# Patient Record
Sex: Male | Born: 1935 | Race: White | Hispanic: No | Marital: Single | Smoking: Former smoker
Health system: Southern US, Community
[De-identification: ages and names within clinical notes are randomized; demographics above are authoritative.]

## PROBLEM LIST (undated history)

## (undated) DIAGNOSIS — I499 Cardiac arrhythmia, unspecified: Secondary | ICD-10-CM

## (undated) HISTORY — PX: PACEMAKER INSERTION: SHX728

## (undated) HISTORY — PX: CHOLECYSTECTOMY: SHX55

---

## 2019-10-09 DIAGNOSIS — I10 Essential (primary) hypertension: Secondary | ICD-10-CM

## 2019-10-09 DIAGNOSIS — R197 Diarrhea, unspecified: Secondary | ICD-10-CM

## 2019-10-09 DIAGNOSIS — R531 Weakness: Secondary | ICD-10-CM

## 2019-10-09 DIAGNOSIS — D649 Anemia, unspecified: Secondary | ICD-10-CM

## 2019-10-09 DIAGNOSIS — A419 Sepsis, unspecified organism: Secondary | ICD-10-CM

## 2019-10-09 DIAGNOSIS — N3001 Acute cystitis with hematuria: Secondary | ICD-10-CM

## 2019-10-09 DIAGNOSIS — I4891 Unspecified atrial fibrillation: Secondary | ICD-10-CM

## 2019-10-10 DIAGNOSIS — I10 Essential (primary) hypertension: Secondary | ICD-10-CM | POA: Diagnosis not present

## 2019-10-10 DIAGNOSIS — N3001 Acute cystitis with hematuria: Secondary | ICD-10-CM | POA: Diagnosis not present

## 2019-10-10 DIAGNOSIS — A419 Sepsis, unspecified organism: Secondary | ICD-10-CM | POA: Diagnosis not present

## 2019-10-10 DIAGNOSIS — I4891 Unspecified atrial fibrillation: Secondary | ICD-10-CM | POA: Diagnosis not present

## 2019-10-11 DIAGNOSIS — N3001 Acute cystitis with hematuria: Secondary | ICD-10-CM | POA: Diagnosis not present

## 2019-10-11 DIAGNOSIS — I4891 Unspecified atrial fibrillation: Secondary | ICD-10-CM | POA: Diagnosis not present

## 2019-10-11 DIAGNOSIS — I10 Essential (primary) hypertension: Secondary | ICD-10-CM | POA: Diagnosis not present

## 2019-10-11 DIAGNOSIS — A419 Sepsis, unspecified organism: Secondary | ICD-10-CM | POA: Diagnosis not present

## 2019-10-12 ENCOUNTER — Encounter (HOSPITAL_COMMUNITY): Payer: Self-pay | Admitting: Internal Medicine

## 2019-10-12 ENCOUNTER — Inpatient Hospital Stay (HOSPITAL_COMMUNITY)
Admission: AD | Admit: 2019-10-12 | Discharge: 2019-10-19 | DRG: 377 | Disposition: A | Discharge status: Skilled Nursing Facility | Payer: 59 | Source: Other Acute Inpatient Hospital | Attending: Internal Medicine | Admitting: Internal Medicine

## 2019-10-12 ENCOUNTER — Other Ambulatory Visit: Payer: Self-pay

## 2019-10-12 ENCOUNTER — Inpatient Hospital Stay (HOSPITAL_COMMUNITY): Payer: 59

## 2019-10-12 DIAGNOSIS — D62 Acute posthemorrhagic anemia: Secondary | ICD-10-CM | POA: Diagnosis present

## 2019-10-12 DIAGNOSIS — Z87891 Personal history of nicotine dependence: Secondary | ICD-10-CM

## 2019-10-12 DIAGNOSIS — Z20822 Contact with and (suspected) exposure to covid-19: Secondary | ICD-10-CM | POA: Diagnosis present

## 2019-10-12 DIAGNOSIS — A419 Sepsis, unspecified organism: Secondary | ICD-10-CM | POA: Diagnosis present

## 2019-10-12 DIAGNOSIS — R05 Cough: Secondary | ICD-10-CM

## 2019-10-12 DIAGNOSIS — K922 Gastrointestinal hemorrhage, unspecified: Secondary | ICD-10-CM | POA: Diagnosis not present

## 2019-10-12 DIAGNOSIS — I4891 Unspecified atrial fibrillation: Secondary | ICD-10-CM | POA: Diagnosis present

## 2019-10-12 DIAGNOSIS — Z833 Family history of diabetes mellitus: Secondary | ICD-10-CM

## 2019-10-12 DIAGNOSIS — R0602 Shortness of breath: Secondary | ICD-10-CM

## 2019-10-12 DIAGNOSIS — N390 Urinary tract infection, site not specified: Secondary | ICD-10-CM | POA: Diagnosis present

## 2019-10-12 DIAGNOSIS — K449 Diaphragmatic hernia without obstruction or gangrene: Secondary | ICD-10-CM | POA: Diagnosis present

## 2019-10-12 DIAGNOSIS — M109 Gout, unspecified: Secondary | ICD-10-CM | POA: Diagnosis present

## 2019-10-12 DIAGNOSIS — E785 Hyperlipidemia, unspecified: Secondary | ICD-10-CM | POA: Diagnosis present

## 2019-10-12 DIAGNOSIS — K5711 Diverticulosis of small intestine without perforation or abscess with bleeding: Secondary | ICD-10-CM | POA: Diagnosis present

## 2019-10-12 DIAGNOSIS — K264 Chronic or unspecified duodenal ulcer with hemorrhage: Principal | ICD-10-CM | POA: Diagnosis present

## 2019-10-12 DIAGNOSIS — J209 Acute bronchitis, unspecified: Secondary | ICD-10-CM | POA: Diagnosis present

## 2019-10-12 DIAGNOSIS — I495 Sick sinus syndrome: Secondary | ICD-10-CM | POA: Diagnosis present

## 2019-10-12 DIAGNOSIS — Z7901 Long term (current) use of anticoagulants: Secondary | ICD-10-CM | POA: Diagnosis not present

## 2019-10-12 DIAGNOSIS — I480 Paroxysmal atrial fibrillation: Secondary | ICD-10-CM | POA: Diagnosis present

## 2019-10-12 DIAGNOSIS — Z6820 Body mass index (BMI) 20.0-20.9, adult: Secondary | ICD-10-CM | POA: Diagnosis not present

## 2019-10-12 DIAGNOSIS — R059 Cough, unspecified: Secondary | ICD-10-CM

## 2019-10-12 DIAGNOSIS — A4150 Gram-negative sepsis, unspecified: Secondary | ICD-10-CM | POA: Diagnosis present

## 2019-10-12 DIAGNOSIS — R64 Cachexia: Secondary | ICD-10-CM | POA: Diagnosis present

## 2019-10-12 DIAGNOSIS — Z950 Presence of cardiac pacemaker: Secondary | ICD-10-CM

## 2019-10-12 HISTORY — DX: Cardiac arrhythmia, unspecified: I49.9

## 2019-10-12 LAB — CBC WITH DIFFERENTIAL/PLATELET
Abs Immature Granulocytes: 0.16 10*3/uL — ABNORMAL HIGH (ref 0.00–0.07)
Basophils Absolute: 0 10*3/uL (ref 0.0–0.1)
Basophils Relative: 0 %
Eosinophils Absolute: 0.2 10*3/uL (ref 0.0–0.5)
Eosinophils Relative: 1 %
HCT: 20.1 % — ABNORMAL LOW (ref 39.0–52.0)
Hemoglobin: 6.6 g/dL — CL (ref 13.0–17.0)
Immature Granulocytes: 1 %
Lymphocytes Relative: 13 %
Lymphs Abs: 1.9 10*3/uL (ref 0.7–4.0)
MCH: 30 pg (ref 26.0–34.0)
MCHC: 32.8 g/dL (ref 30.0–36.0)
MCV: 91.4 fL (ref 80.0–100.0)
Monocytes Absolute: 0.8 10*3/uL (ref 0.1–1.0)
Monocytes Relative: 6 %
Neutro Abs: 11.7 10*3/uL — ABNORMAL HIGH (ref 1.7–7.7)
Neutrophils Relative %: 79 %
Platelets: 152 10*3/uL (ref 150–400)
RBC: 2.2 MIL/uL — ABNORMAL LOW (ref 4.22–5.81)
RDW: 15.7 % — ABNORMAL HIGH (ref 11.5–15.5)
WBC: 14.8 10*3/uL — ABNORMAL HIGH (ref 4.0–10.5)
nRBC: 0 % (ref 0.0–0.2)

## 2019-10-12 LAB — BASIC METABOLIC PANEL
Anion gap: 11 (ref 5–15)
BUN: 41 mg/dL — ABNORMAL HIGH (ref 8–23)
CO2: 19 mmol/L — ABNORMAL LOW (ref 22–32)
Calcium: 7.6 mg/dL — ABNORMAL LOW (ref 8.9–10.3)
Chloride: 113 mmol/L — ABNORMAL HIGH (ref 98–111)
Creatinine, Ser: 0.94 mg/dL (ref 0.61–1.24)
GFR calc Af Amer: >60 mL/min (ref >60)
GFR calc non Af Amer: >60 mL/min (ref >60)
Glucose, Bld: 115 mg/dL — ABNORMAL HIGH (ref 70–99)
Potassium: 4.2 mmol/L (ref 3.5–5.1)
Sodium: 143 mmol/L (ref 135–145)

## 2019-10-12 LAB — LACTIC ACID, PLASMA
Lactic Acid, Venous: 1.9 mmol/L (ref 0.5–1.9)
Lactic Acid, Venous: 1.9 mmol/L (ref 0.5–1.9)

## 2019-10-12 LAB — TROPONIN I (HIGH SENSITIVITY)
Troponin I (High Sensitivity): 28 ng/L — ABNORMAL HIGH (ref <18)
Troponin I (High Sensitivity): 29 ng/L — ABNORMAL HIGH (ref <18)

## 2019-10-12 LAB — HEPATIC FUNCTION PANEL
ALT: 26 U/L (ref 0–44)
AST: 38 U/L (ref 15–41)
Albumin: 2 g/dL — ABNORMAL LOW (ref 3.5–5.0)
Alkaline Phosphatase: 46 U/L (ref 38–126)
Bilirubin, Direct: 0.1 mg/dL (ref 0.0–0.2)
Indirect Bilirubin: 0.1 mg/dL — ABNORMAL LOW (ref 0.3–0.9)
Total Bilirubin: 0.2 mg/dL — ABNORMAL LOW (ref 0.3–1.2)
Total Protein: 4.7 g/dL — ABNORMAL LOW (ref 6.5–8.1)

## 2019-10-12 LAB — HEMOGLOBIN AND HEMATOCRIT, BLOOD
HCT: 25.5 % — ABNORMAL LOW (ref 39.0–52.0)
Hemoglobin: 8.6 g/dL — ABNORMAL LOW (ref 13.0–17.0)

## 2019-10-12 LAB — PROTIME-INR
INR: 1.4 — ABNORMAL HIGH (ref 0.8–1.2)
Prothrombin Time: 17.5 seconds — ABNORMAL HIGH (ref 11.4–15.2)

## 2019-10-12 LAB — TSH: TSH: 2.992 u[IU]/mL (ref 0.350–4.500)

## 2019-10-12 LAB — ABO/RH: ABO/RH(D): O NEG

## 2019-10-12 LAB — SARS CORONAVIRUS 2 (TAT 6-24 HRS): SARS Coronavirus 2: NEGATIVE

## 2019-10-12 LAB — PREPARE RBC (CROSSMATCH)

## 2019-10-12 MED ORDER — SODIUM CHLORIDE 0.9% IV SOLUTION: Freq: Once | INTRAVENOUS | Status: DC

## 2019-10-12 MED ORDER — SODIUM CHLORIDE 0.9 % IV SOLN: INTRAVENOUS | Status: DC

## 2019-10-12 MED ORDER — ONDANSETRON HCL 4 MG/2ML IJ SOLN: 4.0000 mg | Freq: Four times a day (QID) | INTRAMUSCULAR | Status: DC | PRN

## 2019-10-12 MED ORDER — SODIUM CHLORIDE 0.9% FLUSH
10.0000 mL | Freq: Two times a day (BID) | INTRAVENOUS | Status: DC
  Administered 2019-10-13 – 2019-10-18 (×7): 10 mL

## 2019-10-12 MED ORDER — METOPROLOL TARTRATE 5 MG/5ML IV SOLN
5.0000 mg | Freq: Four times a day (QID) | INTRAVENOUS | Status: DC
  Administered 2019-10-12 – 2019-10-13 (×6): 5 mg via INTRAVENOUS
  Filled 2019-10-12 (×6): qty 5

## 2019-10-12 MED ORDER — SODIUM CHLORIDE 0.9 % IV SOLN
80.0000 mg | Freq: Once | INTRAVENOUS | Status: AC
  Administered 2019-10-12: 05:00:00 80 mg via INTRAVENOUS
  Filled 2019-10-12: qty 80

## 2019-10-12 MED ORDER — SODIUM CHLORIDE 0.9 % IV SOLN
2.0000 g | INTRAVENOUS | Status: AC
  Administered 2019-10-12 – 2019-10-16 (×5): 2 g via INTRAVENOUS
  Filled 2019-10-12 (×4): qty 2
  Filled 2019-10-12: qty 20

## 2019-10-12 MED ORDER — PANTOPRAZOLE SODIUM 40 MG IV SOLR: 40.0000 mg | Freq: Two times a day (BID) | INTRAVENOUS | Status: DC

## 2019-10-12 MED ORDER — ACETAMINOPHEN 650 MG RE SUPP: 650.0000 mg | Freq: Four times a day (QID) | RECTAL | Status: DC | PRN

## 2019-10-12 MED ORDER — SODIUM CHLORIDE 0.9% FLUSH: 10.0000 mL | INTRAVENOUS | Status: DC | PRN

## 2019-10-12 MED ORDER — ACETAMINOPHEN 325 MG PO TABS: 650.0000 mg | ORAL_TABLET | Freq: Four times a day (QID) | ORAL | Status: DC | PRN

## 2019-10-12 MED ORDER — SODIUM CHLORIDE 0.9 % IV SOLN
8.0000 mg/h | INTRAVENOUS | Status: DC
  Administered 2019-10-12 – 2019-10-13 (×3): 8 mg/h via INTRAVENOUS
  Filled 2019-10-12 (×4): qty 80

## 2019-10-12 MED ORDER — ONDANSETRON HCL 4 MG PO TABS: 4.0000 mg | ORAL_TABLET | Freq: Four times a day (QID) | ORAL | Status: DC | PRN

## 2019-10-12 NOTE — H&P (Addendum)
History and Physical    Kenneth Howe:[id number] DOB: 1928/07/30 DOA: 10/12/2019  PCP: Patient, No Pcp Per  Patient coming from: Patient was transferred from [facility].  Chief Complaint: Rectal bleeding.  HPI: Kenneth Howe is a 85 y.o. male with history of A. fib gout and pacemaker placement was admitted to [facility] 3 days ago after being found to be weak and had some diarrhea.  Patient was diagnosed with UTI with sepsis and was placed on empiric antibiotics.  Per records urine culture was showing more than 100,000 gram-negative rods blood cultures had not any growth so far.  CT head and chest x-ray were unremarkable over there and Covid test was negative but while in the hospital patient started having dark stools and patient was on Eliquis which was discontinued and stool for occult blood was positive and hemoglobin gradually dropped from admission level of 12 6.  Since there was no GI coverage available patient was transferred to [facility] for further work-up including possible GI intervention.  On my exam patient is not in distress but is tachycardic and monitor shows A. fib with RVR.  Blood pressure is more than 120 systolic.  Patient is nonhypoxic and abdomen appears benign.  Patient's lab work show hemoglobin of 6.6 and WBC of 14.  Creatinine is normal.  I have ordered 2 units of PRBC since patient was found to have moderate amount of dark tarry stools after reaching from [place].  Protonix infusion has been started empiric antibiotics and admitted for further management.  Prior to admitting to [facility] patient was at [facility] for dehydration and renal failure.  Was admitted and received fluids and discharged home but had to come to [place] after patient became more weak.  ED Course: Patient is a direct admit.  Review of Systems: As per HPI, rest all negative.   Past Medical History:  Diagnosis Date  . Dysrhythmia     Past Surgical History:   Procedure Laterality Date  . CHOLECYSTECTOMY    . PACEMAKER INSERTION       reports that he has quit smoking. He has never used smokeless tobacco. He reports previous alcohol use. No history on file for drug.  Not on File  Family History  Problem Relation Age of Onset  . Diabetes Mellitus II Mother     Prior to Admission medications   Not on File    Physical Exam: Constitutional: Moderately built and nourished. Vitals:   10/12/19 0336 10/12/19 0400 10/12/19 0500 10/12/19 0608  BP: 140/88   117/72  Pulse: 74   (!) 28  Resp: 20 18 20 20   Temp: 98.4 F (36.9 C)     TempSrc: Oral     SpO2: 100%   97%  Weight: 73.6 kg      Eyes: Anicteric no pallor. ENMT: No discharge from the ears eyes nose or mouth. Neck: No mass felt.  No neck rigidity. Respiratory: No rhonchi or crepitations. Cardiovascular: S1-S2 heard tachycardic. Abdomen: Soft nontender bowel sounds present. Musculoskeletal: No edema. Skin: No rash. Neurologic: Alert awake oriented to time place and person.  Moves all extremities. Psychiatric: Appears normal.   Labs on Admission: I have personally reviewed following labs and imaging studies  CBC: Recent Labs  Lab 10/12/19 0443  WBC 14.8*  NEUTROABS 11.7*  HGB 6.6*  HCT 20.1*  MCV 91.4  PLT 152   Basic Metabolic Panel: Recent Labs  Lab 10/12/19 0443  NA 143  K 4.2  CL 113*  CO2 19*  GLUCOSE 115*  BUN 41*  CREATININE 0.94  CALCIUM 7.6*   GFR: CrCl cannot be calculated (Unknown ideal weight.). Liver Function Tests: Recent Labs  Lab 10/12/19 0443  AST 38  ALT 26  ALKPHOS 46  BILITOT 0.2*  PROT 4.7*  ALBUMIN 2.0*   No results for input(s): LIPASE, AMYLASE in the last 168 hours. No results for input(s): AMMONIA in the last 168 hours. Coagulation Profile: Recent Labs  Lab 10/12/19 0443  INR 1.4*   Cardiac Enzymes: No results for input(s): CKTOTAL, CKMB, CKMBINDEX, TROPONINI in the last 168 hours. BNP (last 3 results) No results  for input(s): PROBNP in the last 8760 hours. HbA1C: No results for input(s): HGBA1C in the last 72 hours. CBG: No results for input(s): GLUCAP in the last 168 hours. Lipid Profile: No results for input(s): CHOL, HDL, LDLCALC, TRIG, CHOLHDL, LDLDIRECT in the last 72 hours. Thyroid Function Tests: No results for input(s): TSH, T4TOTAL, FREET4, T3FREE, THYROIDAB in the last 72 hours. Anemia Panel: No results for input(s): VITAMINB12, FOLATE, FERRITIN, TIBC, IRON, RETICCTPCT in the last 72 hours. Urine analysis: No results found for: COLORURINE, APPEARANCEUR, LABSPEC, PHURINE, GLUCOSEU, HGBUR, BILIRUBINUR, KETONESUR, PROTEINUR, UROBILINOGEN, NITRITE, LEUKOCYTESUR Sepsis Labs: @LABRCNTIP (procalcitonin:4,lacticidven:4) )No results found for this or any previous visit (from the past 240 hour(s)).   Radiological Exams on Admission: No results found.  EKG: Independently reviewed.  A. fib with RVR.  Assessment/Plan Principal Problem:   Acute GI bleeding Active Problems:   Atrial fibrillation with RVR (HCC)   Acute blood loss anemia   Sepsis (HCC)    1. Acute GI bleeding in the setting of patient taking Eliquis -Eliquis has been held.  Patient has only received 1 unit of PRBC and since patient hemoglobin is still low below 7 I have ordered 2 more units of PRBC after consult.  We will keep patient n.p.o. continue Protonix infusion consult GI. 2. Acute blood loss anemia follow CBC after transfusion.  Eliquis on hold. 3. A. fib with RVR -I have placed patient on scheduled dose of IV metoprolol.  Eliquis on hold due to acute GI bleed patient agreeable. 4. Sepsis from UTI and blood cultures so far at [facility] was negative for any growth urine showed more than 100,000 gram-negative rods.  On ceftriaxone. 5. History of gout. 6. History of pacemaker placement.  COVID-19 test and chest x-ray are pending along with troponins.  Addendum -discussed with Dr. Watt Climes gastroenterologist about  patient admission and will be seeing patient in consult at this time requested clear liquid diet.  Planning EGD tomorrow but earlier if patient decompensates.   DVT prophylaxis: SCDs due to acute GI bleed. Code Status: Full code. Family Communication: I tried to reach patient's daughter but was unable to. Disposition Plan: Back to home when stable. Consults called: Dr. Watt Climes gastroenterologist. Admission status: Inpatient.   Rise Patience MD [facility] Pager [phone].  If 7PM-7AM, please contact night-coverage [email or web address] Password [email or web address]  10/12/2019, 6:08 AM

## 2019-10-12 NOTE — Progress Notes (Signed)
PROGRESS NOTE                                                                                                                                                                                                             Patient Demographics:    Kenneth Howe, is a 85 y.o. male, DOB - [birth year], UJW:[id number]  Admit date - 10/12/2019   Admitting Physician Eduard Clos, MD  Outpatient Primary MD for the patient is Patient, No Pcp Per  LOS - 0   No chief complaint on file.      Brief Narrative    This is a no charge note this patient seen and admitted earlier today by Dr. Toniann Fail.  HPI: Kenneth Howe is a 85 y.o. male with history of A. fib gout and pacemaker placement was admitted to [facility] 3 days ago after being found to be weak and had some diarrhea.  Patient was diagnosed with UTI with sepsis and was placed on empiric antibiotics.  Per records urine culture was showing more than 100,000 gram-negative rods blood cultures had not any growth so far.  CT head and chest x-ray were unremarkable over there and Covid test was negative but while in the hospital patient started having dark stools and patient was on Eliquis which was discontinued and stool for occult blood was positive and hemoglobin gradually dropped from admission level of 12 6.  Since there was no GI coverage available patient was transferred to [facility] for further work-up including possible GI intervention.  On my exam patient is not in distress but is tachycardic and monitor shows A. fib with RVR.  Blood pressure is more than 120 systolic.  Patient is nonhypoxic and abdomen appears benign.  Patient's lab work show hemoglobin of 6.6 and WBC of 14.  Creatinine is normal.  I have ordered 2 units of PRBC since patient was found to have moderate amount of dark tarry stools after reaching from [place].  Protonix infusion has been started empiric antibiotics and admitted for further  management.  Prior to admitting to [facility] patient was at [facility] for dehydration and renal failure.  Was admitted and received fluids and discharged home but had to come to [place] after patient became more weak   Subjective:    Kenneth Howe today denies any chest pain,  shortness of breath, no fever or chills .    Assessment  & Plan :    Principal Problem:   Acute GI bleeding Active Problems:   Atrial fibrillation with RVR (HCC)   Acute blood loss anemia   Sepsis (HCC)  Acute GI bleeding in the setting of patient taking Eliquis - Continue to hold Eliquis, patient received 1 unit [facility], had another 2 units ordered as well, will be total of 3 units today, will check CBC in the afternoon, and transfuse to keep hemoglobin less than 7 . -GI input greatly appreciated, plan for endoscopy tomorrow . -Continue with Protonix drip   Acute blood loss anemia follow CBC after transfusion.  Eliquis on hold.  A. fib with RVR -I have placed patient on scheduled dose of IV metoprolol.  Eliquis on hold due to acute GI bleed patient agreeable.  Sepsis from UTI and blood cultures so far at [facility] was negative for any growth urine showed more than 100,000 gram-negative rods.  On ceftriaxone.  History of gout.  History of pacemaker placement.   COVID-19 Labs  No results for input(s): DDIMER, FERRITIN, LDH, CRP in the last 72 hours.  Lab Results  Component Value Date   SARSCOV2NAA NEGATIVE 10/12/2019     Code Status : Full  Family Communication  : None at bedside  Disposition Plan  : Home  Barriers For Discharge : With active GI bleed requiring frequent H&H monitoring, transfusion and will require endoscopy  Consults  :  GI  Procedures  : None  DVT Prophylaxis  :  Home Eliquis on Hold  Lab Results  Component Value Date   PLT 152 10/12/2019    Antibiotics  :    Anti-infectives (From admission, onward)   Start     Dose/Rate Route  Frequency Ordered Stop   10/12/19 0615  cefTRIAXone (ROCEPHIN) 2 g in sodium chloride 0.9 % 100 mL IVPB     2 g 200 mL/hr over 30 Minutes Intravenous Every 24 hours 10/12/19 0603          Objective:   Vitals:   10/12/19 0718 10/12/19 1018 10/12/19 1036 10/12/19 1235  BP: 91/60 107/70 103/80 112/70  Pulse: 91 99 91 96  Resp: 18 18 20  (!) 23  Temp: 97.8 F (36.6 C) 97.8 F (36.6 C) 97.6 F (36.4 C) 98.6 F (37 C)  TempSrc: Oral Oral Oral Oral  SpO2: 98% 100% 100% 100%  Weight:        Wt Readings from Last 3 Encounters:  10/12/19 73.6 kg     Intake/Output Summary (Last 24 hours) at 10/12/2019 1347 Last data filed at 10/12/2019 0718 Gross per 24 hour  Intake 315 ml  Output --  Net 315 ml     Physical Exam  Awake Alert, Oriented X 3, No new F.N deficits, Normal affect Symmetrical Chest wall movement, Good air movement bilaterally, CTAB RRR,No Gallops,Rubs or new Murmurs, No Parasternal Heave +ve B.Sounds, Abd Soft,No rebound - guarding or rigidity. No Cyanosis, Clubbing or edema, No new Rash or bruise      Data Review:    CBC Recent Labs  Lab 10/12/19 0443  WBC 14.8*  HGB 6.6*  HCT 20.1*  PLT 152  MCV 91.4  MCH 30.0  MCHC 32.8  RDW 15.7*  LYMPHSABS 1.9  MONOABS 0.8  EOSABS 0.2  BASOSABS 0.0    Chemistries  Recent Labs  Lab 10/12/19 0443  NA 143  K 4.2  CL 113*  CO2 19*  GLUCOSE 115*  BUN 41*  CREATININE 0.94  CALCIUM 7.6*  AST 38  ALT 26  ALKPHOS 46  BILITOT 0.2*   ------------------------------------------------------------------------------------------------------------------ No results for input(s): CHOL, HDL, LDLCALC, TRIG, CHOLHDL, LDLDIRECT in the last 72 hours.  No results found for: HGBA1C ------------------------------------------------------------------------------------------------------------------ Recent Labs    10/12/19 0443  TSH 2.992    ------------------------------------------------------------------------------------------------------------------ No results for input(s): VITAMINB12, FOLATE, FERRITIN, TIBC, IRON, RETICCTPCT in the last 72 hours.  Coagulation profile Recent Labs  Lab 10/12/19 0443  INR 1.4*    No results for input(s): DDIMER in the last 72 hours.  Cardiac Enzymes No results for input(s): CKMB, TROPONINI, MYOGLOBIN in the last 168 hours.  Invalid input(s): CK ------------------------------------------------------------------------------------------------------------------ No results found for: BNP  Inpatient Medications  Scheduled Meds: . sodium chloride   Intravenous Once  . metoprolol tartrate  5 mg Intravenous Q6H  . [START ON 10/15/2019] pantoprazole  40 mg Intravenous Q12H   Continuous Infusions: . cefTRIAXone (ROCEPHIN)  IV 2 g (10/12/19 0656)  . pantoprozole (PROTONIX) infusion 8 mg/hr (10/12/19 0457)   PRN Meds:.acetaminophen **OR** acetaminophen, ondansetron **OR** ondansetron (ZOFRAN) IV  Micro Results Recent Results (from the past 240 hour(s))  SARS CORONAVIRUS 2 (TAT 6-24 HRS) Nasopharyngeal Nasopharyngeal Swab     Status: None   Collection Time: 10/12/19  5:09 AM   Specimen: Nasopharyngeal Swab  Result Value Ref Range Status   SARS Coronavirus 2 NEGATIVE NEGATIVE Final    Comment: (NOTE) SARS-CoV-2 target nucleic acids are NOT DETECTED. The SARS-CoV-2 RNA is generally detectable in upper and lower respiratory specimens during the acute phase of infection. Negative results do not preclude SARS-CoV-2 infection, do not rule out co-infections with other pathogens, and should not be used as the sole basis for treatment or other patient management decisions. Negative results must be combined with clinical observations, patient history, and epidemiological information. The expected result is Negative. Fact Sheet for Patients: SugarRoll.be Fact  Sheet for Healthcare Providers: https://www.woods-mathews.com/ This test is not yet approved or cleared by the [place] FDA and  has been authorized for detection and/or diagnosis of SARS-CoV-2 by FDA under an Emergency Use Authorization (EUA). This EUA will remain  in effect (meaning this test can be used) for the duration of the COVID-19 declaration under Section 56 4(b)(1) of the Act, 21 U.S.C. section 360bbb-3(b)(1), unless the authorization is terminated or revoked sooner. Performed at [facility], [place] [address] [place], [place] [address]     Radiology Reports DG Chest [place] 1 View  Result Date: 10/12/2019 CLINICAL DATA:  Shortness of breath.  Pending COVID 19 test results EXAM: PORTABLE CHEST 1 VIEW COMPARISON:  October 09, 2019 FINDINGS: The heart size and mediastinal contours are stable. Cardiac pacemaker is unchanged. Both lungs are clear. The visualized skeletal structures are stable. IMPRESSION: No active disease. Electronically Signed   By: Abelardo Diesel M.D.   On: 10/12/2019 08:31     Phillips Climes M.D on 10/12/2019 at 1:47 PM  Between 7am to 7pm - Pager - [phone]  After 7pm go to [email or web address] - password [facility]  [facility] -  Office  [phone]

## 2019-10-12 NOTE — Progress Notes (Signed)
Admitted from [facility] to [place] and oriented.Attached to cardiac monitoring,CCMD notified,V/S checked,Covid test done.Will continue to monitor.

## 2019-10-12 NOTE — Consult Note (Signed)
Reason for Consult: GI bleed Referring Physician: Hospital team  Kenneth Howe is an 85 y.o. male.  HPI: Patient seen and examined and discussed with his referring hospital as well as the hospital team here and he has been on some blood thinners and does use some Aleve for headaches at home and does have a history of ulcers and he did have a colonoscopy years ago but right now he has no GI complaints and has not had a bowel movement since he has been here and has no other complaints  Past Medical History:  Diagnosis Date  . Dysrhythmia     Past Surgical History:  Procedure Laterality Date  . CHOLECYSTECTOMY    . PACEMAKER INSERTION      Family History  Problem Relation Age of Onset  . Diabetes Mellitus II Mother     Social History:  reports that he has quit smoking. He has never used smokeless tobacco. He reports previous alcohol use. No history on file for drug.  Allergies: Not on File  Medications: I have reviewed the patient's current medications.  Results for orders placed or performed during the hospital encounter of 10/12/19 (from the past 48 hour(s))  Type and screen [facility]     Status: None (Preliminary result)   Collection Time: 10/12/19  4:40 AM  Result Value Ref Range   ABO/RH(D) O NEG    Antibody Screen NEG    Sample Expiration 10/15/2019,2359    Unit Number [id number]    Blood Component Type RED CELLS,LR    Unit division 00    Status of Unit ISSUED    Transfusion Status OK TO TRANSFUSE    Crossmatch Result      Compatible Performed at [facility], [address] [place], [place] [address]    Unit Number [id number]    Blood Component Type RED CELLS,LR    Unit division 00    Status of Unit ALLOCATED    Transfusion Status OK TO TRANSFUSE    Crossmatch Result Compatible   ABO/Rh     Status: None (Preliminary result)   Collection Time: 10/12/19  4:40 AM  Result Value Ref Range   ABO/RH(D)      O NEG Performed at [facility], [address] [place], [place] [address]   CBC with Differential/Platelet     Status: Abnormal   Collection Time: 10/12/19  4:43 AM  Result Value Ref Range   WBC 14.8 (H) 4.0 - 10.5 K/uL   RBC 2.20 (L) 4.22 - 5.81 MIL/uL   Hemoglobin 6.6 (LL) 13.0 - 17.0 g/dL    Comment: REPEATED TO VERIFY THIS CRITICAL RESULT HAS VERIFIED AND BEEN CALLED TO RN REBIN A. BY MESSAN HOUEGNIFIO ON 12 25 2020 AT 0546, AND HAS BEEN READ BACK.     HCT 20.1 (L) 39.0 - 52.0 %   MCV 91.4 80.0 - 100.0 fL   MCH 30.0 26.0 - 34.0 pg   MCHC 32.8 30.0 - 36.0 g/dL   RDW 62.915.7 (H) 52.811.5 - 41.315.5 %   Platelets 152 150 - 400 K/uL   nRBC 0.0 0.0 - 0.2 %   Neutrophils Relative % 79 %   Neutro Abs 11.7 (H) 1.7 - 7.7 K/uL   Lymphocytes Relative 13 %   Lymphs Abs 1.9 0.7 - 4.0 K/uL   Monocytes Relative 6 %   Monocytes Absolute 0.8 0.1 - 1.0 K/uL   Eosinophils Relative 1 %   Eosinophils Absolute  0.2 0.0 - 0.5 K/uL   Basophils Relative 0 %   Basophils Absolute 0.0 0.0 - 0.1 K/uL   Immature Granulocytes 1 %   Abs Immature Granulocytes 0.16 (H) 0.00 - 0.07 K/uL    Comment: Performed at [facility] [address], [place], [place] [address]  Basic metabolic panel     Status: Abnormal   Collection Time: 10/12/19  4:43 AM  Result Value Ref Range   Sodium 143 135 - 145 mmol/L   Potassium 4.2 3.5 - 5.1 mmol/L   Chloride 113 (H) 98 - 111 mmol/L   CO2 19 (L) 22 - 32 mmol/L   Glucose, Bld 115 (H) 70 - 99 mg/dL   BUN 41 (H) 8 - 23 mg/dL   Creatinine, Ser 0.94 0.61 - 1.24 mg/dL   Calcium 7.6 (L) 8.9 - 10.3 mg/dL   GFR calc non Af Amer >60 >60 mL/min   GFR calc Af Amer >60 >60 mL/min   Anion gap 11 5 - 15    Comment: Performed at [facility], [place] [address], [place], [place] [address]  Hepatic function panel     Status: Abnormal   Collection Time: 10/12/19  4:43 AM  Result Value Ref Range   Total Protein 4.7 (L) 6.5 - 8.1 g/dL   Albumin 2.0 (L) 3.5 - 5.0 g/dL   AST 38 15 - 41 U/L   ALT 26 0 - 44 U/L    Alkaline Phosphatase 46 38 - 126 U/L   Total Bilirubin 0.2 (L) 0.3 - 1.2 mg/dL   Bilirubin, Direct 0.1 0.0 - 0.2 mg/dL   Indirect Bilirubin 0.1 (L) 0.3 - 0.9 mg/dL    Comment: Performed at [facility] [address] [place], [place] [address]  Protime-INR     Status: Abnormal   Collection Time: 10/12/19  4:43 AM  Result Value Ref Range   Prothrombin Time 17.5 (H) 11.4 - 15.2 seconds   INR 1.4 (H) 0.8 - 1.2    Comment: (NOTE) INR goal varies based on device and disease states. Performed at [facility], [place] [address], [place], [place] [address]   Lactic acid, plasma     Status: None   Collection Time: 10/12/19  4:43 AM  Result Value Ref Range   Lactic Acid, Venous 1.9 0.5 - 1.9 mmol/L    Comment: Performed at [facility] [address] [place], [place] [address]  TSH     Status: None   Collection Time: 10/12/19  4:43 AM  Result Value Ref Range   TSH 2.992 0.350 - 4.500 uIU/mL    Comment: Performed by a 3rd Generation assay with a functional sensitivity of <=0.01 uIU/mL. Performed at [facility], [place] [address] [place], [place] [address]   Troponin I (High Sensitivity)     Status: Abnormal   Collection Time: 10/12/19  4:43 AM  Result Value Ref Range   Troponin I (High Sensitivity) 28 (H) <18 ng/L    Comment: (NOTE) Elevated high sensitivity troponin I (hsTnI) values and significant  changes across serial measurements may suggest ACS but many other  chronic and acute conditions are known to elevate hsTnI results.  Refer to the "Links" section for chest pain algorithms and additional  guidance. Performed at [facility], [place] [address], [place], [place] [address]   Lactic acid, plasma     Status: None   Collection Time: 10/12/19  6:04 AM  Result Value Ref Range   Lactic Acid, Venous 1.9 0.5 - 1.9 mmol/L    Comment: Performed at [facility], [address], [place], [place] [address]  Troponin I (High Sensitivity)     Status:  Abnormal   Collection Time: 10/12/19  6:04 AM  Result Value Ref Range   Troponin I (High Sensitivity) 29 (H) <18 ng/L    Comment: (NOTE) Elevated high sensitivity troponin I (hsTnI) values and significant  changes across serial measurements may suggest ACS but many other  chronic and acute conditions are known to elevate hsTnI results.  Refer to the "Links" section for chest pain algorithms and additional  guidance. Performed at [facility], [address], [place], [place] [address]   Prepare RBC     Status: None   Collection Time: 10/12/19  6:30 AM  Result Value Ref Range   Order Confirmation      ORDER PROCESSED BY BLOOD BANK Performed at [facility], [address] [place], [place] [address]     DG Chest Port 1 View  Result Date: 10/12/2019 CLINICAL DATA:  Shortness of breath.  Pending COVID 19 test results EXAM: PORTABLE CHEST 1 VIEW COMPARISON:  October 09, 2019 FINDINGS: The heart size and mediastinal contours are stable. Cardiac pacemaker is unchanged. Both lungs are clear. The visualized skeletal structures are stable. IMPRESSION: No active disease. Electronically Signed   By: Sherian Rein M.D.   On: 10/12/2019 08:31    Review of Systems negative except above Blood pressure 91/60, pulse 91, temperature 97.8 F (36.6 C), temperature source Oral, resp. rate 18, weight 73.6 kg, SpO2 98 %. Physical Exam vital signs stable afebrile no acute distress lungs are clear decreased heart sounds abdomen is soft nontender BUN up over baseline decreased hemoglobin white count minimally elevated Assessment/Plan: Melena in a patient with increased BUN on blood thinners and possibly some nonsteroidals at home with history of ulcers Plan: We discussed endoscopy including the risks and he has had one before he says and will allow clear liquids today and proceed with endoscopy tomorrow and call me sooner if signs of bleeding or other GI question or problem  Zakariah Urwin  E 10/12/2019, 9:00 AM

## 2019-10-13 ENCOUNTER — Encounter (HOSPITAL_COMMUNITY): Payer: Self-pay | Admitting: Internal Medicine

## 2019-10-13 ENCOUNTER — Inpatient Hospital Stay (HOSPITAL_COMMUNITY): Payer: 59 | Admitting: Certified Registered"

## 2019-10-13 ENCOUNTER — Encounter (HOSPITAL_COMMUNITY)
Admission: AD | Disposition: A | Discharge status: Skilled Nursing Facility | Payer: Self-pay | Source: Other Acute Inpatient Hospital | Attending: Internal Medicine

## 2019-10-13 ENCOUNTER — Other Ambulatory Visit: Payer: Self-pay

## 2019-10-13 HISTORY — PX: ESOPHAGOGASTRODUODENOSCOPY (EGD) WITH PROPOFOL: SHX5813

## 2019-10-13 LAB — TYPE AND SCREEN
ABO/RH(D): O NEG
Antibody Screen: NEGATIVE
Unit division: 0
Unit division: 0

## 2019-10-13 LAB — GLUCOSE, CAPILLARY
Glucose-Capillary: 77 mg/dL (ref 70–99)
Glucose-Capillary: 78 mg/dL (ref 70–99)
Glucose-Capillary: 85 mg/dL (ref 70–99)
Glucose-Capillary: 88 mg/dL (ref 70–99)

## 2019-10-13 LAB — BASIC METABOLIC PANEL
Anion gap: 6 (ref 5–15)
BUN: 28 mg/dL — ABNORMAL HIGH (ref 8–23)
CO2: 21 mmol/L — ABNORMAL LOW (ref 22–32)
Calcium: 7.7 mg/dL — ABNORMAL LOW (ref 8.9–10.3)
Chloride: 113 mmol/L — ABNORMAL HIGH (ref 98–111)
Creatinine, Ser: 0.94 mg/dL (ref 0.61–1.24)
GFR calc Af Amer: >60 mL/min (ref >60)
GFR calc non Af Amer: >60 mL/min (ref >60)
Glucose, Bld: 97 mg/dL (ref 70–99)
Potassium: 3.8 mmol/L (ref 3.5–5.1)
Sodium: 140 mmol/L (ref 135–145)

## 2019-10-13 LAB — CBC
HCT: 27.3 % — ABNORMAL LOW (ref 39.0–52.0)
Hemoglobin: 9 g/dL — ABNORMAL LOW (ref 13.0–17.0)
MCH: 30 pg (ref 26.0–34.0)
MCHC: 33 g/dL (ref 30.0–36.0)
MCV: 91 fL (ref 80.0–100.0)
Platelets: 160 10*3/uL (ref 150–400)
RBC: 3 MIL/uL — ABNORMAL LOW (ref 4.22–5.81)
RDW: 15.6 % — ABNORMAL HIGH (ref 11.5–15.5)
WBC: 10.9 10*3/uL — ABNORMAL HIGH (ref 4.0–10.5)
nRBC: 0.6 % — ABNORMAL HIGH (ref 0.0–0.2)

## 2019-10-13 LAB — BPAM RBC
Blood Product Expiration Date: 202012282359
Blood Product Expiration Date: 202101042359
ISSUE DATE / TIME: 202012250644
ISSUE DATE / TIME: 202012251007
Unit Type and Rh: 9500
Unit Type and Rh: 9500

## 2019-10-13 SURGERY — ESOPHAGOGASTRODUODENOSCOPY (EGD) WITH PROPOFOL
Anesthesia: Monitor Anesthesia Care

## 2019-10-13 MED ORDER — PRAVASTATIN SODIUM 40 MG PO TABS
40.0000 mg | ORAL_TABLET | Freq: Every day | ORAL | Status: DC
  Administered 2019-10-13 – 2019-10-18 (×6): 40 mg via ORAL
  Filled 2019-10-13 (×6): qty 1

## 2019-10-13 MED ORDER — PANTOPRAZOLE SODIUM 40 MG PO TBEC
40.0000 mg | DELAYED_RELEASE_TABLET | Freq: Two times a day (BID) | ORAL | Status: DC
  Administered 2019-10-13 – 2019-10-19 (×12): 40 mg via ORAL
  Filled 2019-10-13 (×11): qty 1

## 2019-10-13 MED ORDER — LACTATED RINGERS IV SOLN: INTRAVENOUS | Status: DC

## 2019-10-13 MED ORDER — ALLOPURINOL 100 MG PO TABS
100.0000 mg | ORAL_TABLET | Freq: Every day | ORAL | Status: DC
  Administered 2019-10-13 – 2019-10-19 (×7): 100 mg via ORAL
  Filled 2019-10-13 (×7): qty 1

## 2019-10-13 MED ORDER — ENSURE ENLIVE PO LIQD
237.0000 mL | Freq: Two times a day (BID) | ORAL | Status: DC
  Administered 2019-10-14 – 2019-10-19 (×7): 237 mL via ORAL

## 2019-10-13 MED ORDER — METOPROLOL SUCCINATE ER 25 MG PO TB24
25.0000 mg | ORAL_TABLET | Freq: Every day | ORAL | Status: DC
  Administered 2019-10-14 – 2019-10-19 (×6): 25 mg via ORAL
  Filled 2019-10-13 (×6): qty 1

## 2019-10-13 MED ORDER — PROPOFOL 500 MG/50ML IV EMUL
INTRAVENOUS | Status: DC | PRN
  Administered 2019-10-13: 100 ug/kg/min via INTRAVENOUS

## 2019-10-13 SURGICAL SUPPLY — 14 items

## 2019-10-13 NOTE — Progress Notes (Addendum)
PROGRESS NOTE                                                                                                                                                                                                             Patient Demographics:    Kenneth Howe, is a 85 y.o. male, DOB - 1927/11/24, EXB:[id number]  Admit date - 10/12/2019   Admitting Physician Eduard Clos, MD  Outpatient Primary MD for the patient is Patient, No Pcp Per  LOS - 1   No chief complaint on file.      Brief Narrative    85 y.o. male with history of A. Fib, on Eliquis, gout gout and pacemaker placement was admitted to [facility] 3 days ago after being found to be weak and had some diarrhea.  Patient was diagnosed with UTI with sepsis and was placed on empiric antibiotics.  Per records urine culture was showing more than 100,000 gram-negative rods blood cultures had not any growth so far., while in the hospital patient started having dark stools and patient was on Eliquis, his hemoglobin dropped from 12.6 to 5.5, he was transferred to [facility] for further work-up.  He did require 2 units PRBC transfusion here(another 1 units was received at [facility]), patient went for endoscopy 12/26, significant for duodenal ulcer(with no stigmata of bleeding).   Subjective:    Kenneth Howe today denies any chest pain, shortness of breath, no fever or chills , he reports generalized weakness and fatigue.   Assessment  & Plan :    Principal Problem:   Acute GI bleeding Active Problems:   Atrial fibrillation with RVR (HCC)   Acute blood loss anemia   Sepsis (HCC)  Acute GI bleeding in the setting of patient taking Eliquis  -  patient received 1 unit at [facility] ED, had another 2 units received here as well . -Eliquis currently on hold . -On Protonix drip . -GI consult greatly appreciated, status post endoscopy 12/26 significant  for One non-bleeding cratered duodenal ulcer  with no stigmata of bleeding was found in the duodenal bulb, GI recommendation to continue PPI twice daily for 2 weeks, and to avoid NSAIDs, and to resume Eliquis in few days. -Currently on soft diet.  Acute blood loss anemia  -Secondary to upper GI bleed, monitor CBC closely and transfuse as  needed  Sepsis from UTI  - blood cultures so far at [facility] was negative for any growth urine showed more than 100,000 gram-negative rods.  On ceftriaxone.  A. fib with RVR -  On p.o. metoprolol at home for heart rate control, was resumed. -Request remains on hold, can be resumed in few days per GI recommendation.  History of gout. -Continue with allopurinol  Hyperlipidemia -Continue with pravastatin  History of pacemaker placement.   COVID-19 Labs  No results for input(s): DDIMER, FERRITIN, LDH, CRP in the last 72 hours.  Lab Results  Component Value Date   [place]ripp NEGATIVE 10/12/2019     Code Status : Full  Family Communication  : None at bedside  Disposition Plan  : Patient appears to be extremely frail, will consult PT is likely will need SNF  Consults  :  GI  Procedures  : EGD  DVT Prophylaxis  :  Home Eliquis on Hold  Lab Results  Component Value Date   PLT 160 10/13/2019    Antibiotics  :    Anti-infectives (From admission, onward)   Start     Dose/Rate Route Frequency Ordered Stop   10/12/19 0615  cefTRIAXone (ROCEPHIN) 2 g in sodium chloride 0.9 % 100 mL IVPB     2 g 200 mL/hr over 30 Minutes Intravenous Every 24 hours 10/12/19 0603          Objective:   Vitals:   10/13/19 0956 10/13/19 1006 10/13/19 1010 10/13/19 1245  BP: (!) 87/54 (!) 87/47 (!) 105/55 112/72  Pulse: 100 85 92 72  Resp: 16 16 (!) 21 16  Temp: 97.8 F (36.6 C)   98.4 F (36.9 C)  TempSrc: Axillary   Oral  SpO2: 96% 100% 94% 100%  Weight:      Height:        Wt Readings from Last 3 Encounters:  10/12/19 73.6 kg     Intake/Output Summary (Last 24 hours) at  10/13/2019 1448 Last data filed at 10/13/2019 1245 Gross per 24 hour  Intake 460 ml  Output 1400 ml  Net -940 ml     Physical Exam Awake Alert, Oriented X 3, No new F.N deficits, Normal affect, extremely frail, Symmetrical Chest wall movement, Good air movement bilaterally, CTAB RRR,No Gallops,Rubs or new Murmurs, No Parasternal Heave +ve B.Sounds, Abd Soft, No tenderness, No rebound - guarding or rigidity. No Cyanosis, Clubbing or edema, No new Rash or bruise       Data Review:    CBC Recent Labs  Lab 10/12/19 0443 10/12/19 1735 10/13/19 0427  WBC 14.8*  --  10.9*  HGB 6.6* 8.6* 9.0*  HCT 20.1* 25.5* 27.3*  PLT 152  --  160  MCV 91.4  --  91.0  MCH 30.0  --  30.0  MCHC 32.8  --  33.0  RDW 15.7*  --  15.6*  LYMPHSABS 1.9  --   --   MONOABS 0.8  --   --   EOSABS 0.2  --   --   BASOSABS 0.0  --   --     Chemistries  Recent Labs  Lab 10/12/19 0443 10/13/19 0427  NA 143 140  K 4.2 3.8  CL 113* 113*  CO2 19* 21*  GLUCOSE 115* 97  BUN 41* 28*  CREATININE 0.94 0.94  CALCIUM 7.6* 7.7*  AST 38  --   ALT 26  --   ALKPHOS 46  --   BILITOT 0.2*  --    ------------------------------------------------------------------------------------------------------------------  No results for input(s): CHOL, HDL, LDLCALC, TRIG, CHOLHDL, LDLDIRECT in the last 72 hours.  No results found for: HGBA1C ------------------------------------------------------------------------------------------------------------------ Recent Labs    10/12/19 0443  TSH 2.992   ------------------------------------------------------------------------------------------------------------------ No results for input(s): VITAMINB12, FOLATE, FERRITIN, TIBC, IRON, RETICCTPCT in the last 72 hours.  Coagulation profile Recent Labs  Lab 10/12/19 0443  INR 1.4*    No results for input(s): DDIMER in the last 72 hours.  Cardiac Enzymes No results for input(s): CKMB, TROPONINI, MYOGLOBIN in the last  168 hours.  Invalid input(s): CK ------------------------------------------------------------------------------------------------------------------ No results found for: BNP  Inpatient Medications  Scheduled Meds: . sodium chloride   Intravenous Once  . metoprolol tartrate  5 mg Intravenous Q6H  . pantoprazole  40 mg Oral BID AC  . sodium chloride flush  10-40 mL Intracatheter Q12H   Continuous Infusions: . cefTRIAXone (ROCEPHIN)  IV Stopped (10/13/19 0557)   PRN Meds:.acetaminophen **OR** acetaminophen, ondansetron **OR** ondansetron (ZOFRAN) IV, sodium chloride flush  Micro Results Recent Results (from the past 240 hour(s))  Culture, blood (routine x 2)     Status: None (Preliminary result)   Collection Time: 10/12/19  4:43 AM   Specimen: BLOOD RIGHT HAND  Result Value Ref Range Status   Specimen Description BLOOD RIGHT HAND  Final   Special Requests   Final    BOTTLES DRAWN AEROBIC ONLY Blood Culture results may not be optimal due to an inadequate volume of blood received in culture bottles   Culture   Final    NO GROWTH 1 DAY Performed at [facility], [address], [place], [place] [address]    Report Status PENDING  Incomplete  Culture, blood (routine x 2)     Status: None (Preliminary result)   Collection Time: 10/12/19  4:43 AM   Specimen: BLOOD RIGHT FOREARM  Result Value Ref Range Status   Specimen Description BLOOD RIGHT FOREARM  Final   Special Requests   Final    BOTTLES DRAWN AEROBIC ONLY Blood Culture adequate volume   Culture   Final    NO GROWTH 1 DAY Performed at [facility], [address], [place], [place] [address]    Report Status PENDING  Incomplete  SARS CORONAVIRUS 2 (TAT 6-24 HRS) Nasopharyngeal Nasopharyngeal Swab     Status: None   Collection Time: 10/12/19  5:09 AM   Specimen: Nasopharyngeal Swab  Result Value Ref Range Status   SARS Coronavirus 2 NEGATIVE NEGATIVE Final    Comment: (NOTE) SARS-CoV-2 target nucleic acids  are NOT DETECTED. The SARS-CoV-2 RNA is generally detectable in upper and lower respiratory specimens during the acute phase of infection. Negative results do not preclude SARS-CoV-2 infection, do not rule out co-infections with other pathogens, and should not be used as the sole basis for treatment or other patient management decisions. Negative results must be combined with clinical observations, patient history, and epidemiological information. The expected result is Negative. Fact Sheet for Patients: HairSlick.no Fact Sheet for Healthcare Providers: quierodirigir.com This test is not yet approved or cleared by the [place] FDA and  has been authorized for detection and/or diagnosis of SARS-CoV-2 by FDA under an Emergency Use Authorization (EUA). This EUA will remain  in effect (meaning this test can be used) for the duration of the COVID-19 declaration under Section 56 4(b)(1) of the Act, 21 U.S.C. section 360bbb-3(b)(1), unless the authorization is terminated or revoked sooner. Performed at [facility], [address] [place], [place] [address]  Radiology Reports DG Chest Port 1 View  Result Date: 10/12/2019 CLINICAL DATA:  Shortness of breath.  Pending COVID 19 test results EXAM: PORTABLE CHEST 1 VIEW COMPARISON:  October 09, 2019 FINDINGS: The heart size and mediastinal contours are stable. Cardiac pacemaker is unchanged. Both lungs are clear. The visualized skeletal structures are stable. IMPRESSION: No active disease. Electronically Signed   By: Sherian ReinWei-Chen  Lin M.D.   On: 10/12/2019 08:31     Huey Bienenstockawood Monnie Gudgel M.D on 10/13/2019 at 2:48 PM  Between 7am to 7pm - Pager - [phone]  After 7pm go to [email or web address] - password [email or web address]  [facility] -  Office  [phone]

## 2019-10-13 NOTE — Progress Notes (Signed)
Kenneth Howe 8:52 AM  Subjective: Patient without any signs of bleeding and no new complaints and we rediscussed his history of ulcers and his endoscopy  Objective: Vital signs stable afebrile no acute distress exam please see preassessment evaluation hemoglobin okay decreased BUN and creatinine Assessment: GI blood loss and patient on blood thinner and possibly nonsteroidals at home  Plan: Okay to proceed with endoscopy with anesthesia assistance  [facility]  office [phone] After 5PM or if no answer call [phone]

## 2019-10-13 NOTE — Anesthesia Postprocedure Evaluation (Signed)
Anesthesia Post Note  Patient: Alma Downs  Procedure(s) Performed: ESOPHAGOGASTRODUODENOSCOPY (EGD) WITH PROPOFOL (N/A )     Patient location during evaluation: Endoscopy Anesthesia Type: MAC Level of consciousness: awake and alert, patient cooperative and oriented Pain management: pain level controlled Vital Signs Assessment: post-procedure vital signs reviewed and stable Respiratory status: spontaneous breathing, nonlabored ventilation and respiratory function stable Cardiovascular status: blood pressure returned to baseline and stable Postop Assessment: no apparent nausea or vomiting Anesthetic complications: no    Last Vitals:  Vitals:   10/13/19 1010 10/13/19 1245  BP: (!) 105/55 112/72  Pulse: 92 72  Resp: (!) 21 16  Temp:  36.9 C  SpO2: 94% 100%    Last Pain:  Vitals:   10/13/19 1245  TempSrc: Oral  PainSc:                  Latangela Mccomas,E. Shannette Tabares

## 2019-10-13 NOTE — Progress Notes (Signed)
Earlier tonight CBG was 86 . Patient c/o hungry  Recheck CBG it was 88. Skin warm and dry.

## 2019-10-13 NOTE — Op Note (Signed)
[facility] Patient Name: Kenneth Howe Procedure Date : 10/13/2019 MRN: [id number] Attending MD: Vida Rigger , MD Date of Birth: 27-May-1928 CSN: [id number] Age: 85 Admit Type: Inpatient Procedure:                Upper GI endoscopy Indications:              Acute post hemorrhagic anemia, Melena Providers:                Vida Rigger, MD, Blenda Mounts, RN, Lawson Radar,                            Technician, Jefm Miles, CRNA Referring MD:              Medicines:                Propofol total dose 75 mg IV Complications:            No immediate complications. Estimated Blood Loss:     Estimated blood loss: none. Procedure:                Pre-Anesthesia Assessment:                           - Prior to the procedure, a History and Physical                            was performed, and patient medications and                            allergies were reviewed. The patient's tolerance of                            previous anesthesia was also reviewed. The risks                            and benefits of the procedure and the sedation                            options and risks were discussed with the patient.                            All questions were answered, and informed consent                            was obtained. Prior Anticoagulants: The patient has                            taken Eliquis (apixaban), last dose was 2 days                            prior to procedure. ASA Grade Assessment: III - A                            patient with severe systemic disease. After  reviewing the risks and benefits, the patient was                            deemed in satisfactory condition to undergo the                            procedure.                           After obtaining informed consent, the endoscope was                            passed under direct vision. Throughout the                            procedure, the patient's blood pressure,  pulse, and                            oxygen saturations were monitored continuously. The                            GIF-H190 ([id number] Olympus gastroscope was                            introduced through the mouth, and advanced to the                            third part of duodenum. The upper GI endoscopy was                            accomplished without difficulty. The patient                            tolerated the procedure well. Scope In: Scope Out: Findings:      A small hiatal hernia was present.      A single small submucosal papule (nodule) with no bleeding was found in       the gastric antrum.      One non-bleeding cratered duodenal ulcer with no stigmata of bleeding       was found in the duodenal bulb.      A medium non-bleeding diverticulum was found in the second portion of       the duodenum.      The third portion of the duodenum was normal.      The exam was otherwise without abnormality. Impression:               - Small hiatal hernia.                           - A single submucosal papule (nodule) found in the                            stomach.                           - Non-bleeding duodenal ulcer with  no stigmata of                            bleeding.                           - Non-bleeding duodenal diverticulum.                           - Normal third portion of the duodenum.                           - The examination was otherwise normal.                           - No specimens collected. Recommendation:           - Soft diet today. Continue pump inhibitors twice                            daily for 2 weeks and then can decrease to 1 a day                            long-term                           - No aspirin, ibuprofen, naproxen, or other                            non-steroidal anti-inflammatory drugs. He does tell                            me he takes Aleve at home which he must stop if he                            is going back on  blood thinner                           - Resume Eliquis (apixaban) at prior dose in a few                            days but reevaluate if its needed going forward                           - Return to GI clinic PRN.                           - Telephone GI clinic if symptomatic PRN. Procedure Code(s):        --- Professional ---                           [phone], Esophagogastroduodenoscopy, flexible,                            transoral; diagnostic, including collection of  specimen(s) by brushing or washing, when performed                            (separate procedure) Diagnosis Code(s):        --- Professional ---                           K44.9, Diaphragmatic hernia without obstruction or                            gangrene                           K31.89, Other diseases of stomach and duodenum                           K26.9, Duodenal ulcer, unspecified as acute or                            chronic, without hemorrhage or perforation                           D62, Acute posthemorrhagic anemia                           K92.1, Melena (includes Hematochezia)                           K57.10, Diverticulosis of small intestine without                            perforation or abscess without bleeding CPT copyright 2019 American Medical Association. All rights reserved. The codes documented in this report are preliminary and upon coder review may  be revised to meet current compliance requirements. Clarene Essex, MD 10/13/2019 10:05:26 AM This report has been signed electronically. Number of Addenda: 0

## 2019-10-13 NOTE — Transfer of Care (Signed)
Immediate Anesthesia Transfer of Care Note  Patient: Kenneth Howe  Procedure(s) Performed: ESOPHAGOGASTRODUODENOSCOPY (EGD) WITH PROPOFOL (N/A )  Patient Location: Endoscopy Unit  Anesthesia Type:MAC  Level of Consciousness: drowsy and patient cooperative  Airway & Oxygen Therapy: Patient Spontanous Breathing and Patient connected to nasal cannula oxygen  Post-op Assessment: Report given to RN  Post vital signs: Reviewed and stable  Last Vitals:  Vitals Value Taken Time  BP 87/54 10/13/19 0956  Temp    Pulse 100 10/13/19 0956  Resp 16 10/13/19 0956  SpO2 96 % 10/13/19 0956  Vitals shown include unvalidated device data.  Last Pain:  Vitals:   10/13/19 0828  TempSrc: Oral  PainSc: 0-No pain         Complications: No apparent anesthesia complications

## 2019-10-13 NOTE — Anesthesia Procedure Notes (Signed)
Procedure Name: MAC Date/Time: 10/13/2019 9:34 AM Performed by: Barrington Ellison, CRNA Pre-anesthesia Checklist: Patient identified, Emergency Drugs available, Suction available and Patient being monitored Patient Re-evaluated:Patient Re-evaluated prior to induction Oxygen Delivery Method: Nasal cannula

## 2019-10-13 NOTE — Anesthesia Preprocedure Evaluation (Signed)
Anesthesia Evaluation  Patient identified by MRN, date of birth, ID band Patient awake    Reviewed: Allergy & Precautions, NPO status , Patient's Chart, lab work & pertinent test results, reviewed documented beta blocker date and time   History of Anesthesia Complications Negative for: history of anesthetic complications  Airway Mallampati: I  TM Distance: >3 FB Neck ROM: Full    Dental  (+) Edentulous Upper, Edentulous Lower   Pulmonary former smoker,  10/12/2019 SARS coronavirus NEG   breath sounds clear to auscultation       Cardiovascular hypertension, Pt. on medications and Pt. on home beta blockers (-) angina+ dysrhythmias Atrial Fibrillation + pacemaker  Rhythm:Regular Rate:Normal  '17 stress : Negative electrocardiographic response to vasodilator stress, Normal myocardial perfusion scan. Normal left ventricular systolic function wall motion.   Neuro/Psych negative neurological ROS     GI/Hepatic Neg liver ROS, GERD  Controlled,Acute GI bleed   Endo/Other  negative endocrine ROS  Renal/GU negative Renal ROS     Musculoskeletal   Abdominal   Peds  Hematology  (+) Blood dyscrasia (Hb 9.0), anemia , eliquis   Anesthesia Other Findings   Reproductive/Obstetrics                             Anesthesia Physical Anesthesia Plan  ASA: III  Anesthesia Plan: MAC   Post-op Pain Management:    Induction:   PONV Risk Score and Plan: 1 and Treatment may vary due to age or medical condition  Airway Management Planned: Natural Airway and Nasal Cannula  Additional Equipment:   Intra-op Plan:   Post-operative Plan:   Informed Consent: I have reviewed the patients History and Physical, chart, labs and discussed the procedure including the risks, benefits and alternatives for the proposed anesthesia with the patient or authorized representative who has indicated his/her understanding and  acceptance.     Dental advisory given  Plan Discussed with: CRNA and Surgeon  Anesthesia Plan Comments:         Anesthesia Quick Evaluation

## 2019-10-14 LAB — CBC
HCT: 24.4 % — ABNORMAL LOW (ref 39.0–52.0)
Hemoglobin: 8 g/dL — ABNORMAL LOW (ref 13.0–17.0)
MCH: 29.9 pg (ref 26.0–34.0)
MCHC: 32.8 g/dL (ref 30.0–36.0)
MCV: 91 fL (ref 80.0–100.0)
Platelets: 145 10*3/uL — ABNORMAL LOW (ref 150–400)
RBC: 2.68 MIL/uL — ABNORMAL LOW (ref 4.22–5.81)
RDW: 15.7 % — ABNORMAL HIGH (ref 11.5–15.5)
WBC: 9 10*3/uL (ref 4.0–10.5)
nRBC: 0.3 % — ABNORMAL HIGH (ref 0.0–0.2)

## 2019-10-14 LAB — BASIC METABOLIC PANEL
Anion gap: 8 (ref 5–15)
BUN: 23 mg/dL (ref 8–23)
CO2: 20 mmol/L — ABNORMAL LOW (ref 22–32)
Calcium: 7.6 mg/dL — ABNORMAL LOW (ref 8.9–10.3)
Chloride: 111 mmol/L (ref 98–111)
Creatinine, Ser: 0.93 mg/dL (ref 0.61–1.24)
GFR calc Af Amer: >60 mL/min (ref >60)
GFR calc non Af Amer: >60 mL/min (ref >60)
Glucose, Bld: 141 mg/dL — ABNORMAL HIGH (ref 70–99)
Potassium: 3.5 mmol/L (ref 3.5–5.1)
Sodium: 139 mmol/L (ref 135–145)

## 2019-10-14 LAB — GLUCOSE, CAPILLARY
Glucose-Capillary: 118 mg/dL — ABNORMAL HIGH (ref 70–99)
Glucose-Capillary: 100 mg/dL — ABNORMAL HIGH (ref 70–99)
Glucose-Capillary: 117 mg/dL — ABNORMAL HIGH (ref 70–99)
Glucose-Capillary: 95 mg/dL (ref 70–99)

## 2019-10-14 MED ORDER — LEVALBUTEROL HCL 0.63 MG/3ML IN NEBU: 0.6300 mg | INHALATION_SOLUTION | Freq: Two times a day (BID) | RESPIRATORY_TRACT | Status: DC

## 2019-10-14 MED ORDER — SODIUM CHLORIDE 3 % IN NEBU
4.0000 mL | INHALATION_SOLUTION | Freq: Two times a day (BID) | RESPIRATORY_TRACT | Status: AC
  Administered 2019-10-14 – 2019-10-16 (×5): 4 mL via RESPIRATORY_TRACT
  Filled 2019-10-14 (×6): qty 4

## 2019-10-14 MED ORDER — IPRATROPIUM BROMIDE 0.02 % IN SOLN
0.5000 mg | Freq: Two times a day (BID) | RESPIRATORY_TRACT | Status: DC
  Administered 2019-10-14 – 2019-10-19 (×10): 0.5 mg via RESPIRATORY_TRACT
  Filled 2019-10-14 (×10): qty 2.5

## 2019-10-14 MED ORDER — SODIUM CHLORIDE 3 % IN NEBU
4.0000 mL | INHALATION_SOLUTION | Freq: Two times a day (BID) | RESPIRATORY_TRACT | Status: DC
  Filled 2019-10-14 (×2): qty 4

## 2019-10-14 MED ORDER — DM-GUAIFENESIN ER 30-600 MG PO TB12
2.0000 | ORAL_TABLET | Freq: Two times a day (BID) | ORAL | Status: AC
  Administered 2019-10-14 – 2019-10-17 (×6): 2 via ORAL
  Filled 2019-10-14 (×6): qty 2

## 2019-10-14 MED ORDER — IPRATROPIUM BROMIDE 0.02 % IN SOLN: 0.5000 mg | Freq: Two times a day (BID) | RESPIRATORY_TRACT | Status: DC

## 2019-10-14 MED ORDER — LEVALBUTEROL HCL 0.63 MG/3ML IN NEBU
0.6300 mg | INHALATION_SOLUTION | Freq: Two times a day (BID) | RESPIRATORY_TRACT | Status: DC
  Administered 2019-10-14 – 2019-10-19 (×10): 0.63 mg via RESPIRATORY_TRACT
  Filled 2019-10-14 (×10): qty 3

## 2019-10-14 NOTE — Progress Notes (Addendum)
PROGRESS NOTE  Kenneth Howe OEV:[id number] DOB: [birth year] DOA: 10/12/2019 PCP: Patient, No Pcp Per  HPI/Recap of past 24 hours: Brief Narrative    85 y.o.malewithhistory of A. Fib, on Eliquis, gout gout and pacemaker placement was admitted to [facility] 3 days ago after being found to be weak and had some diarrhea. Patient was diagnosed with UTI with sepsis and was placed on empiric antibiotics. Per records urine culture was showing more than 100,000 gram-negative rods blood cultures had not any growth so far., while in the hospital patient started having dark stools and patient was on Eliquis, his hemoglobin dropped from 12.6 to 5.5, he was transferred to [facility] for further work-up.  He did require 2 units PRBC transfusion here(another 1 units was received at [facility]), patient went for endoscopy 12/26, significant for duodenal ulcer(with no stigmata of bleeding).  10/14/19: Seen and examined.  Reports productive cough.  Denies chest pain.  Also reports increasing urinary frequency, on Rocephin for presumed UTI empirically.  Assessment/Plan: Principal Problem:   Acute GI bleeding Active Problems:   Atrial fibrillation with RVR (HCC)   Acute blood loss anemia   Sepsis (HCC)  Acute GI bleeding in the setting of patient taking Eliquis -  patient received 1 unit at [facility] ED, had another 2 units received here as well . -Eliquis currently on hold . -Currently on p.o. Protonix 40 mg twice daily. -GI consult greatly appreciated, status post endoscopy 12/26 significant  for One non-bleeding cratered duodenal ulcer with no stigmata of bleeding was found in the duodenal bulb and nonbleeding duodenal diverticulum. GI recommendation to continue PPI twice daily for 6 weeks, and to avoid NSAIDs, and to resume Eliquis in 2-3 days if no further bleeding. -Currently on soft diet, continue. Follow-up with GI outpatient as needed.  Acute bronchitis Productive cough on  exam Reviewed chest x-ray done on 10/12/2019 no lobular infiltrates Continue symptomatic treatment Start pulmonary toilet, hypersaline nebs twice daily, Mucinex twice daily, Start bronchodilators Xopenex nebs and ipratropium nebs  Acute blood loss anemia  -Secondary to upper GI bleed, monitor CBC closely and transfuse as needed - hemoglobin dropped to 8.0 from 9.0 -Continue to monitor H&H and signs of overt bleeding  Sepsis from UTI  - Presented with leukocytosis, tachycardia and positive urine analysis.  Obtain urine culture.  Currently on Rocephin empirically.  Blood cultures so far at [facility] was negative for any growth urine showed more than 100,000 gram-negative rods.   Paroxysmal A. fib with RVR - On p.o. metoprolol at home for heart rate control, was resumed. -Eliquis remains on hold, can be resumed in few days per GI recommendation.  History of gout. -Continue with allopurinol  Hyperlipidemia -Continue with pravastatin  History of tachybradycardia s/p pacemaker placement.  Physical debility PT OT to assess once more stable Fall precautions.  COVID-19 Labs  Recent Labs (last 2 labs)   No results for input(s): DDIMER, FERRITIN, LDH, CRP in the last 72 hours.    Recent Labs       Lab Results  Component Value Date   [place] NEGATIVE 10/12/2019       Code Status : Full  Family Communication  : None at bedside  Disposition Plan  : Patient appears to be extremely frail, will consult PT is likely will need SNF  Consults  :  GI  Procedures  : EGD  DVT Prophylaxis  :  Home Eliquis on Hold     Objective: Vitals:  10/13/19 2354 10/14/19 0321 10/14/19 0349 10/14/19 0839  BP: 107/71 129/89  119/85  Pulse: 94 99 96 74  Resp: 19 19 20  (!) 21  Temp: 97.6 F (36.4 C) 98.3 F (36.8 C)  98.6 F (37 C)  TempSrc: Oral Oral  Oral  SpO2: 100% 98% 98% 99%  Weight:      Height:        Intake/Output Summary (Last 24 hours) at  10/14/2019 1000 Last data filed at 10/14/2019 0511 Gross per 24 hour  Intake 460 ml  Output 725 ml  Net -265 ml   Filed Weights   10/12/19 0336  Weight: 73.6 kg    Exam:  . General: 85 y.o. year-old male well developed well nourished in no acute distress.  Alert and oriented x3. . Cardiovascular: Regular rate and rhythm with no rubs or gallops.  No thyromegaly or JVD noted.   92 Respiratory: Clear to auscultation with no wheezes or rales. Good inspiratory effort.  Productive cough on exam. . Abdomen: Soft nontender nondistended with normal bowel sounds x4 quadrants. . Musculoskeletal: No lower extremity edema. 2/4 pulses in all 4 extremities. Marland Kitchen Psychiatry: Mood is appropriate for condition and setting   Data Reviewed: CBC: Recent Labs  Lab 10/12/19 0443 10/12/19 1735 10/13/19 0427 10/14/19 0505  WBC 14.8*  --  10.9* 9.0  NEUTROABS 11.7*  --   --   --   HGB 6.6* 8.6* 9.0* 8.0*  HCT 20.1* 25.5* 27.3* 24.4*  MCV 91.4  --  91.0 91.0  PLT 152  --  160 145*   Basic Metabolic Panel: Recent Labs  Lab 10/12/19 0443 10/13/19 0427 10/14/19 0505  NA 143 140 139  K 4.2 3.8 3.5  CL 113* 113* 111  CO2 19* 21* 20*  GLUCOSE 115* 97 141*  BUN 41* 28* 23  CREATININE 0.94 0.94 0.93  CALCIUM 7.6* 7.7* 7.6*   GFR: Estimated Creatinine Clearance: 56.1 mL/min (by C-G formula based on SCr of 0.93 mg/dL). Liver Function Tests: Recent Labs  Lab 10/12/19 0443  AST 38  ALT 26  ALKPHOS 46  BILITOT 0.2*  PROT 4.7*  ALBUMIN 2.0*   No results for input(s): LIPASE, AMYLASE in the last 168 hours. No results for input(s): AMMONIA in the last 168 hours. Coagulation Profile: Recent Labs  Lab 10/12/19 0443  INR 1.4*   Cardiac Enzymes: No results for input(s): CKTOTAL, CKMB, CKMBINDEX, TROPONINI in the last 168 hours. BNP (last 3 results) No results for input(s): PROBNP in the last 8760 hours. HbA1C: No results for input(s): HGBA1C in the last 72 hours. CBG: Recent Labs  Lab  10/13/19 0410 10/13/19 0802 10/13/19 1651 10/14/19 0024 10/14/19 0837  GLUCAP 88 85 78 118* 100*   Lipid Profile: No results for input(s): CHOL, HDL, LDLCALC, TRIG, CHOLHDL, LDLDIRECT in the last 72 hours. Thyroid Function Tests: Recent Labs    10/12/19 0443  TSH 2.992   Anemia Panel: No results for input(s): VITAMINB12, FOLATE, FERRITIN, TIBC, IRON, RETICCTPCT in the last 72 hours. Urine analysis: No results found for: COLORURINE, APPEARANCEUR, LABSPEC, PHURINE, GLUCOSEU, HGBUR, BILIRUBINUR, KETONESUR, PROTEINUR, UROBILINOGEN, NITRITE, LEUKOCYTESUR Sepsis Labs: @LABRCNTIP (procalcitonin:4,lacticidven:4)  ) Recent Results (from the past 240 hour(s))  Culture, blood (routine x 2)     Status: None (Preliminary result)   Collection Time: 10/12/19  4:43 AM   Specimen: BLOOD RIGHT HAND  Result Value Ref Range Status   Specimen Description BLOOD RIGHT HAND  Final   Special Requests   Final  BOTTLES DRAWN AEROBIC ONLY Blood Culture results may not be optimal due to an inadequate volume of blood received in culture bottles   Culture   Final    NO GROWTH 2 DAYS Performed at [facility], [address] [place], [place] [address]    Report Status PENDING  Incomplete  Culture, blood (routine x 2)     Status: None (Preliminary result)   Collection Time: 10/12/19  4:43 AM   Specimen: BLOOD RIGHT FOREARM  Result Value Ref Range Status   Specimen Description BLOOD RIGHT FOREARM  Final   Special Requests   Final    BOTTLES DRAWN AEROBIC ONLY Blood Culture adequate volume   Culture   Final    NO GROWTH 2 DAYS Performed at [facility], [address] [place], [place] [address]    Report Status PENDING  Incomplete  SARS CORONAVIRUS 2 (TAT 6-24 HRS) Nasopharyngeal Nasopharyngeal Swab     Status: None   Collection Time: 10/12/19  5:09 AM   Specimen: Nasopharyngeal Swab  Result Value Ref Range Status   SARS Coronavirus 2 NEGATIVE NEGATIVE Final    Comment:  (NOTE) SARS-CoV-2 target nucleic acids are NOT DETECTED. The SARS-CoV-2 RNA is generally detectable in upper and lower respiratory specimens during the acute phase of infection. Negative results do not preclude SARS-CoV-2 infection, do not rule out co-infections with other pathogens, and should not be used as the sole basis for treatment or other patient management decisions. Negative results must be combined with clinical observations, patient history, and epidemiological information. The expected result is Negative. Fact Sheet for Patients: HairSlick.nohttps://www.fda.gov/media/138098/download Fact Sheet for Healthcare Providers: quierodirigir.comhttps://www.fda.gov/media/138095/download This test is not yet approved or cleared by the [place] FDA and  has been authorized for detection and/or diagnosis of SARS-CoV-2 by FDA under an Emergency Use Authorization (EUA). This EUA will remain  in effect (meaning this test can be used) for the duration of the COVID-19 declaration under Section 56 4(b)(1) of the Act, 21 U.S.C. section 360bbb-3(b)(1), unless the authorization is terminated or revoked sooner. Performed at [facility], [address] [place], [place] [address]       Studies: No results found.  Scheduled Meds: . sodium chloride   Intravenous Once  . allopurinol  100 mg Oral Daily  . feeding supplement (ENSURE ENLIVE)  237 mL Oral BID BM  . metoprolol succinate  25 mg Oral Daily  . pantoprazole  40 mg Oral BID AC  . pravastatin  40 mg Oral QHS  . sodium chloride flush  10-40 mL Intracatheter Q12H    Continuous Infusions: . cefTRIAXone (ROCEPHIN)  IV 2 g (10/14/19 0511)     LOS: 2 days     Darlin Droparole N Josemaria Brining, MD [facility] Pager [phone]  If 7PM-7AM, please contact night-coverage [email or web address] Password [email or web address] 10/14/2019, 10:00 AM

## 2019-10-14 NOTE — Evaluation (Signed)
Physical Therapy Evaluation Patient Details Name: Kenneth Howe MRN: [id number] DOB: 06/06/1928 Today's Date: 10/14/2019   History of Present Illness  Patient is a 85 y/o male who presents as tx from [place] with UTI with sepsis and acute GI bleed. Endoscopy- duodenal ulcer 12/26. PMH includes A. fib, gout and pacemaker placement.  Clinical Impression  Patient presents with generalized weakness, dyspnea on exertion, decreased activity tolerance, impaired balance and impaired mobility s/p above. Pt reports requiring assist for ADLs at home and uses rollator for ambulation. Lives with daughter and son in law. Today, pt requires Max A of bed mobility, Mod A for standing and Min A for SPT. Mobility assessment limited due to HR ranging from 90-156 bpm, A-fib, with worsening SOB. Would benefit from SNF to maximize independence and mobility prior to return home. If family able to provide necessary level of assist and want pt to return home, recommend HHPT and BSC. Will follow acutely.    Follow Up Recommendations SNF;Supervision for mobility/OOB    Equipment Recommendations  3in1 (PT)    Recommendations for Other Services       Precautions / Restrictions Precautions Precautions: Fall Precaution Comments: watch HR Restrictions Weight Bearing Restrictions: No      Mobility  Bed Mobility Overal bed mobility: Needs Assistance Bed Mobility: Supine to Sit     Supine to sit: Max assist;HOB elevated     General bed mobility comments: Assist with trunk and to scoot bottom to EOB. Cues for sequencing/technique.  Transfers Overall transfer level: Needs assistance Equipment used: Rolling walker (2 wheeled) Transfers: Sit to/from [employer] Sit to Stand: Mod assist;From elevated surface Stand pivot transfers: Min assist       General transfer comment: Use of momentum, cues for hand placement and assist to power up to standing, stood from EOB x2, from [facility] x1. SPT bed to [facility]  Min A. Placed chair behind pt after BSC due to tachycardia and SOB.  Ambulation/Gait Ambulation/Gait assistance: Min assist Gait Distance (Feet): 2 Feet Assistive device: Rolling walker (2 wheeled) Gait Pattern/deviations: Trunk flexed;Shuffle     General Gait Details: Able to take a few steps to chair, but limited due to 3/4 DOE, and HR up to 153 bpm A fib.  Stairs            Wheelchair Mobility    Modified Rankin (Stroke Patients Only)       Balance Overall balance assessment: Needs assistance Sitting-balance support: Feet supported;Single extremity supported Sitting balance-Leahy Scale: Fair Sitting balance - Comments: Close Min guard.   Standing balance support: During functional activity Standing balance-Leahy Scale: Poor Standing balance comment: Requires BUE support and physical A for balance. Total A for pericare.                             Pertinent Vitals/Pain Pain Assessment: Faces Faces Pain Scale: Hurts little more Pain Location: bil knees, chronic Pain Descriptors / Indicators: Discomfort;Aching Pain Intervention(s): Monitored during session;Repositioned    Home Living Family/patient expects to be discharged to:: Private residence Living Arrangements: Children(daughter and son in Social worker) Available Help at Discharge: Family;Available 24 hours/day Type of Home: House Home Access: Ramped entrance     Home Layout: Laundry or work area in basement;One level Home Equipment: Walker - 2 wheels;Walker - 4 wheels      Prior Function Level of Independence: Needs assistance   Gait / Transfers Assistance Needed: Uses rollator for ambulation.  ADL's / Homemaking Assistance Needed: Daughter assists with ADLs.        Hand Dominance   Dominant Hand: Right    Extremity/Trunk Assessment   Upper Extremity Assessment Upper Extremity Assessment: Defer to OT evaluation    Lower Extremity Assessment Lower Extremity Assessment: Generalized  weakness       Communication   Communication: HOH  Cognition Arousal/Alertness: Awake/alert Behavior During Therapy: WFL for tasks assessed/performed Overall Cognitive Status: Impaired/Different from baseline Area of Impairment: Orientation;Memory;Problem solving                 Orientation Level: Disoriented to;Time   Memory: Decreased short-term memory       Problem Solving: Slow processing;Difficulty sequencing;Decreased initiation;Requires verbal cues;Requires tactile cues General Comments: Slow processing at times, thought it was november      General Comments General comments (skin integrity, edema, etc.): HR ranged from 90-156 bpm, A-fib, SOB present.    Exercises     Assessment/Plan    PT Assessment Patient needs continued PT services  PT Problem List Decreased strength;Decreased mobility;Decreased safety awareness;Decreased balance;Pain;Cardiopulmonary status limiting activity;Decreased cognition;Decreased activity tolerance       PT Treatment Interventions Therapeutic activities;Gait training;Therapeutic exercise;Patient/family education;Balance training;Functional mobility training    PT Goals (Current goals can be found in the Care Plan section)  Acute Rehab PT Goals Patient Stated Goal: to get better PT Goal Formulation: With patient Time For Goal Achievement: 10/28/19 Potential to Achieve Goals: Fair    Frequency Min 3X/week   Barriers to discharge        Co-evaluation               AM-PAC PT "6 Clicks" Mobility  Outcome Measure Help needed turning from your back to your side while in a flat bed without using bedrails?: A Lot Help needed moving from lying on your back to sitting on the side of a flat bed without using bedrails?: Total Help needed moving to and from a bed to a chair (including a wheelchair)?: A Lot Help needed standing up from a chair using your arms (e.g., wheelchair or bedside chair)?: A Lot Help needed to walk in  hospital room?: A Little Help needed climbing 3-5 steps with a railing? : A Lot 6 Click Score: 12    End of Session Equipment Utilized During Treatment: Oxygen;Gait belt Activity Tolerance: Treatment limited secondary to medical complications (Comment)(tachycardia) Patient left: in chair;with call bell/phone within reach;with chair alarm set Nurse Communication: Mobility status PT Visit Diagnosis: Muscle weakness (generalized) (M62.81);Unsteadiness on feet (R26.81);Difficulty in walking, not elsewhere classified (R26.2)    Time: 5852-7782 PT Time Calculation (min) (ACUTE ONLY): 32 min   Charges:   PT Evaluation $PT Eval Moderate Complexity: 1 Mod PT Treatments $Therapeutic Activity: 8-22 mins        Marisa Severin, PT, DPT Acute Rehabilitation Services Pager [phone] Office [phone]      Marguarite Arbour A Sabra Heck 10/14/2019, 12:15 PM

## 2019-10-14 NOTE — Progress Notes (Signed)
Subjective: Still feels weak. No abdominal pain. No blood in stool.  Objective: Vital signs in last 24 hours: Temp:  [97 F (36.1 C)-98.6 F (37 C)] 97 F (36.1 C) (12/27 1115) Pulse Rate:  [72-103] 78 (12/27 1115) Resp:  [16-21] 20 (12/27 1115) BP: (107-139)/(60-89) 107/60 (12/27 1115) SpO2:  [97 %-100 %] 97 % (12/27 1115) Weight change:  Last BM Date: 10/12/19  PE: GEN:  Elderly, cachectic, NAD SKIN:  Pale  Lab Results: CBC    Component Value Date/Time   WBC 9.0 10/14/2019 0505   RBC 2.68 (L) 10/14/2019 0505   HGB 8.0 (L) 10/14/2019 0505   HCT 24.4 (L) 10/14/2019 0505   PLT 145 (L) 10/14/2019 0505   MCV 91.0 10/14/2019 0505   MCH 29.9 10/14/2019 0505   MCHC 32.8 10/14/2019 0505   RDW 15.7 (H) 10/14/2019 0505   LYMPHSABS 1.9 10/12/2019 0443   MONOABS 0.8 10/12/2019 0443   EOSABS 0.2 10/12/2019 0443   BASOSABS 0.0 10/12/2019 0443   CMP     Component Value Date/Time   NA 139 10/14/2019 0505   K 3.5 10/14/2019 0505   CL 111 10/14/2019 0505   CO2 20 (L) 10/14/2019 0505   GLUCOSE 141 (H) 10/14/2019 0505   BUN 23 10/14/2019 0505   CREATININE 0.93 10/14/2019 0505   CALCIUM 7.6 (L) 10/14/2019 0505   PROT 4.7 (L) 10/12/2019 0443   ALBUMIN 2.0 (L) 10/12/2019 0443   AST 38 10/12/2019 0443   ALT 26 10/12/2019 0443   ALKPHOS 46 10/12/2019 0443   BILITOT 0.2 (L) 10/12/2019 0443   GFRNONAA >60 10/14/2019 0505   GFRAA >60 10/14/2019 0505    Assessment:  1.  Acute blood loss anemia. 2.  Duodenal ulcer, clean based. 3.  GI bleeding, likely from #2 above and causing #1 above.  No further bleeding at this point. 4.  Chronic anticoagulation, apixaban.  Plan:  1.  Continue oral ppi bid x 6 weeks, then transition to qd and continue this dose indefinitely thereafter. 2.  Follow CBC. 3.  Resume apixaban in 2-3 days ok if no further bleeding. 4.  [facility] will revisit tomorrow.   Landry Dyke 10/14/2019, 11:18 AM   Cell [phone] If no answer or after 5  PM call [phone]

## 2019-10-15 ENCOUNTER — Inpatient Hospital Stay (HOSPITAL_COMMUNITY): Payer: 59

## 2019-10-15 LAB — GLUCOSE, CAPILLARY
Glucose-Capillary: 103 mg/dL — ABNORMAL HIGH (ref 70–99)
Glucose-Capillary: 113 mg/dL — ABNORMAL HIGH (ref 70–99)
Glucose-Capillary: 121 mg/dL — ABNORMAL HIGH (ref 70–99)

## 2019-10-15 LAB — URINE CULTURE
Culture: NO GROWTH
Special Requests: NORMAL

## 2019-10-15 LAB — CBC
HCT: 24.7 % — ABNORMAL LOW (ref 39.0–52.0)
Hemoglobin: 8 g/dL — ABNORMAL LOW (ref 13.0–17.0)
MCH: 30 pg (ref 26.0–34.0)
MCHC: 32.4 g/dL (ref 30.0–36.0)
MCV: 92.5 fL (ref 80.0–100.0)
Platelets: 158 10*3/uL (ref 150–400)
RBC: 2.67 MIL/uL — ABNORMAL LOW (ref 4.22–5.81)
RDW: 15.9 % — ABNORMAL HIGH (ref 11.5–15.5)
WBC: 8.4 10*3/uL (ref 4.0–10.5)
nRBC: 0.2 % (ref 0.0–0.2)

## 2019-10-15 MED ORDER — AZITHROMYCIN 500 MG PO TABS
500.0000 mg | ORAL_TABLET | Freq: Every day | ORAL | Status: AC
  Filled 2019-10-15 (×2): qty 1

## 2019-10-15 MED ORDER — AZITHROMYCIN 250 MG PO TABS
250.0000 mg | ORAL_TABLET | Freq: Every day | ORAL | Status: AC
  Administered 2019-10-16 – 2019-10-19 (×4): 250 mg via ORAL
  Filled 2019-10-15 (×4): qty 1

## 2019-10-15 NOTE — Progress Notes (Signed)
PROGRESS NOTE  Kenneth Howe WER:[id number] DOB: 1927-11-09 DOA: 10/12/2019 PCP: Patient, No Pcp Per  HPI/Recap of past 24 hours: Brief Narrative    85 y.o.malewithhistory of A. Fib, on Eliquis, gout gout and pacemaker placement was admitted to [facility] 3 days ago after being found to be weak and had some diarrhea. Patient was diagnosed with UTI with sepsis and was placed on empiric antibiotics. Per records urine culture was showing more than 100,000 gram-negative rods blood cultures had not any growth so far., while in the hospital patient started having dark stools and patient was on Eliquis, his hemoglobin dropped from 12.6 to 5.5, he was transferred to [facility] for further work-up.  He did require 2 units PRBC transfusion here(another 1 units was received at [facility]), patient went for endoscopy 12/26, significant for duodenal ulcer(with no stigmata of bleeding).  10/15/19: Seen and examined.  Reports persistent productive cough.  Will obtain sputum culture and chest x-ray.    Assessment/Plan: Principal Problem:   Acute GI bleeding Active Problems:   Atrial fibrillation with RVR (HCC)   Acute blood loss anemia   Sepsis (HCC)  Acute GI bleeding in the setting of patient taking Eliquis -  patient received 1 unit at [facility] ED, had another 2 units received here as well . -Resume Eliquis once no recurrent bleeding -Currently on p.o. Protonix 40 mg twice daily. -Status post endoscopy 12/26 significant  for One non-bleeding cratered duodenal ulcer with no stigmata of bleeding was found in the duodenal bulb and nonbleeding duodenal diverticulum. GI recommendation to continue PPI twice daily for 6 weeks, and to avoid NSAIDs, and to resume Eliquis in 2-3 days if no further bleeding. -Currently on soft diet, continue. Follow-up with GI outpatient as needed.  Acute bronchitis Productive cough on exam, persistent Reviewed chest x-ray done on 10/12/2019 no lobular  infiltrates Obtain sputum culture and chest x-ray Azithromycin, Z-Pak added for its anti-inflammatory properties Continue symptomatic management C/w pulmonary toilet, hypersaline nebs twice daily, Mucinex twice daily, C/w bronchodilators Xopenex nebs and ipratropium nebs  Acute blood loss anemia  -Secondary to upper GI bleed, monitor CBC closely and transfuse as needed - hemoglobin dropped to 8.0 from 9.0 but stable today. -Continue to monitor H&H and signs of overt bleeding  Sepsis from UTI  - Presented with leukocytosis, tachycardia and positive urine analysis.  Urine culture in process.  Currently on Rocephin empirically.   urine culture at [facility] showed more than 100,000 gram-negative rods.   Paroxysmal A. fib with RVR - Rate is controlled on p.o. metoprolol  Eliquis will be resumed once no recurrent bleeding. Hemoglobin is currently stable  History of gout. -Continue with allopurinol  Hyperlipidemia -Continue with pravastatin  History of tachybradycardia s/p pacemaker placement.  Physical debility PT recommended SNF Continue PT OT with assistance of for precautions.  COVID-19 Labs  Recent Labs (last 2 labs)   No results for input(s): DDIMER, FERRITIN, LDH, CRP in the last 72 hours.    Recent Labs       Lab Results  Component Value Date   [place] NEGATIVE 10/12/2019       Code Status : Full  Family Communication  : None at bedside  Disposition Plan :  Plan to DC to SNF in the next 24 to 48 hours once hemodynamically stable and a bed is available.  Consults  :  GI  Procedures  : EGD  DVT Prophylaxis  :   SCDs.  Will resume Eliquis  once no recurrent bleeding.     Objective: Vitals:   10/15/19 0720 10/15/19 0807 10/15/19 0856 10/15/19 0935  BP:  121/72    Pulse:  84 100   Resp:  18    Temp:  98 F (36.7 C)    TempSrc:  Oral    SpO2: 98% 98%  100%  Weight:      Height:        Intake/Output Summary (Last 24  hours) at 10/15/2019 1033 Last data filed at 10/14/2019 2202 Gross per 24 hour  Intake 120 ml  Output 0 ml  Net 120 ml   Filed Weights   10/12/19 0336  Weight: 73.6 kg    Exam:  . General: 85 y.o. year-old male well developed well nourished, appears uncomfortable due to persistent productive cough.  Alert and oriented x3.   . Cardiovascular: Regular rate and rhythm no rubs or gallops. Marland Kitchen Respiratory: Mild rales at bases no wheezing noted.   . Abdomen: Soft nontender normal bowel sounds present. . Musculoskeletal: No lower extremity edema. Marland Kitchen Psychiatry: Mood is appropriate for condition and setting.   Data Reviewed: CBC: Recent Labs  Lab 10/12/19 0443 10/12/19 1735 10/13/19 0427 10/14/19 0505 10/15/19 0500  WBC 14.8*  --  10.9* 9.0 8.4  NEUTROABS 11.7*  --   --   --   --   HGB 6.6* 8.6* 9.0* 8.0* 8.0*  HCT 20.1* 25.5* 27.3* 24.4* 24.7*  MCV 91.4  --  91.0 91.0 92.5  PLT 152  --  160 145* 158   Basic Metabolic Panel: Recent Labs  Lab 10/12/19 0443 10/13/19 0427 10/14/19 0505  NA 143 140 139  K 4.2 3.8 3.5  CL 113* 113* 111  CO2 19* 21* 20*  GLUCOSE 115* 97 141*  BUN 41* 28* 23  CREATININE 0.94 0.94 0.93  CALCIUM 7.6* 7.7* 7.6*   GFR: Estimated Creatinine Clearance: 56.1 mL/min (by C-G formula based on SCr of 0.93 mg/dL). Liver Function Tests: Recent Labs  Lab 10/12/19 0443  AST 38  ALT 26  ALKPHOS 46  BILITOT 0.2*  PROT 4.7*  ALBUMIN 2.0*   No results for input(s): LIPASE, AMYLASE in the last 168 hours. No results for input(s): AMMONIA in the last 168 hours. Coagulation Profile: Recent Labs  Lab 10/12/19 0443  INR 1.4*   Cardiac Enzymes: No results for input(s): CKTOTAL, CKMB, CKMBINDEX, TROPONINI in the last 168 hours. BNP (last 3 results) No results for input(s): PROBNP in the last 8760 hours. HbA1C: No results for input(s): HGBA1C in the last 72 hours. CBG: Recent Labs  Lab 10/14/19 0837 10/14/19 1146 10/14/19 1646 10/15/19 0028  10/15/19 0806  GLUCAP 100* 117* 95 113* 103*   Lipid Profile: No results for input(s): CHOL, HDL, LDLCALC, TRIG, CHOLHDL, LDLDIRECT in the last 72 hours. Thyroid Function Tests: No results for input(s): TSH, T4TOTAL, FREET4, T3FREE, THYROIDAB in the last 72 hours. Anemia Panel: No results for input(s): VITAMINB12, FOLATE, FERRITIN, TIBC, IRON, RETICCTPCT in the last 72 hours. Urine analysis: No results found for: COLORURINE, APPEARANCEUR, LABSPEC, PHURINE, GLUCOSEU, HGBUR, BILIRUBINUR, KETONESUR, PROTEINUR, UROBILINOGEN, NITRITE, LEUKOCYTESUR Sepsis Labs: @LABRCNTIP (procalcitonin:4,lacticidven:4)  ) Recent Results (from the past 240 hour(s))  Culture, blood (routine x 2)     Status: None (Preliminary result)   Collection Time: 10/12/19  4:43 AM   Specimen: BLOOD RIGHT HAND  Result Value Ref Range Status   Specimen Description BLOOD RIGHT HAND  Final   Special Requests   Final    BOTTLES  DRAWN AEROBIC ONLY Blood Culture results may not be optimal due to an inadequate volume of blood received in culture bottles   Culture   Final    NO GROWTH 2 DAYS Performed at [facility], [address] [place], [place] [address]    Report Status PENDING  Incomplete  Culture, blood (routine x 2)     Status: None (Preliminary result)   Collection Time: 10/12/19  4:43 AM   Specimen: BLOOD RIGHT FOREARM  Result Value Ref Range Status   Specimen Description BLOOD RIGHT FOREARM  Final   Special Requests   Final    BOTTLES DRAWN AEROBIC ONLY Blood Culture adequate volume   Culture   Final    NO GROWTH 2 DAYS Performed at [facility], [address] [place], [place] [address]    Report Status PENDING  Incomplete  SARS CORONAVIRUS 2 (TAT 6-24 HRS) Nasopharyngeal Nasopharyngeal Swab     Status: None   Collection Time: 10/12/19  5:09 AM   Specimen: Nasopharyngeal Swab  Result Value Ref Range Status   SARS Coronavirus 2 NEGATIVE NEGATIVE Final    Comment: (NOTE) SARS-CoV-2 target  nucleic acids are NOT DETECTED. The SARS-CoV-2 RNA is generally detectable in upper and lower respiratory specimens during the acute phase of infection. Negative results do not preclude SARS-CoV-2 infection, do not rule out co-infections with other pathogens, and should not be used as the sole basis for treatment or other patient management decisions. Negative results must be combined with clinical observations, patient history, and epidemiological information. The expected result is Negative. Fact Sheet for Patients: HairSlick.nohttps://www.fda.gov/media/138098/download Fact Sheet for Healthcare Providers: quierodirigir.comhttps://www.fda.gov/media/138095/download This test is not yet approved or cleared by the [place] FDA and  has been authorized for detection and/or diagnosis of SARS-CoV-2 by FDA under an Emergency Use Authorization (EUA). This EUA will remain  in effect (meaning this test can be used) for the duration of the COVID-19 declaration under Section 56 4(b)(1) of the Act, 21 U.S.C. section 360bbb-3(b)(1), unless the authorization is terminated or revoked sooner. Performed at [facility], [address] [place], [place] [address]       Studies: No results found.  Scheduled Meds: . sodium chloride   Intravenous Once  . allopurinol  100 mg Oral Daily  . dextromethorphan-guaiFENesin  2 tablet Oral BID  . feeding supplement (ENSURE ENLIVE)  237 mL Oral BID BM  . ipratropium  0.5 mg Nebulization BID  . levalbuterol  0.63 mg Nebulization BID  . metoprolol succinate  25 mg Oral Daily  . pantoprazole  40 mg Oral BID AC  . pravastatin  40 mg Oral QHS  . sodium chloride flush  10-40 mL Intracatheter Q12H  . sodium chloride HYPERTONIC  4 mL Nebulization BID    Continuous Infusions: . cefTRIAXone (ROCEPHIN)  IV 2 g (10/15/19 0550)     LOS: 3 days     Darlin Droparole N Marteze Vecchio, MD [facility] Pager [phone]  If 7PM-7AM, please contact night-coverage [email or web address] Password  [email or web address] 10/15/2019, 10:33 AM

## 2019-10-15 NOTE — Progress Notes (Signed)
Initial Nutrition Assessment  DOCUMENTATION CODES:   Not applicable  INTERVENTION:    Ensure Enlive po BID, each supplement provides 350 kcal and 20 grams of protein  MVI daily   NUTRITION DIAGNOSIS:   Increased nutrient needs related to acute illness as evidenced by estimated needs.  GOAL:   Patient will meet greater than or equal to 90% of their needs  MONITOR:   PO intake, Supplement acceptance, Weight trends, Labs, I & O's  REASON FOR ASSESSMENT:   Consult Assessment of nutrition requirement/status  ASSESSMENT:   Patient with PMH significant for a.fib and s/p PPM. Presents this admission with acute GI bleeding.   12/26- EGD shows duodenal ulcer   Pt denies having a loss of appetite PTA. Lives with family who does most of the cooking. Typically eats B- cereal with milk L- leftovers from dinner D- beans, potatoes, and meat. Does not use supplementation. RD observed breakfast tray 100% completed. Willing to drink Ensure.   Pt endorses a UBW of 132 lb and denies recent wt loss. Records indicate pt weighed 153 lb on 12/22 and 173.6 kg this admission (weighing error?). Suspect malnutrition but unable to diagnose without accurate weight.   I/O: +240 ml since admit UOP: 600 ml x 24 hrs   Medications: abx Labs: CBG 95-118  Diet Order:   Diet Order            DIET SOFT Room service appropriate? Yes; Fluid consistency: Thin  Diet effective now              EDUCATION NEEDS:   Education needs have been addressed  Skin:  Skin Assessment: Skin Integrity Issues: Skin Integrity Issues:: Other (Comment) Other: MASD- buttocks  Last BM:  12/26  Height:   Ht Readings from Last 1 Encounters:  10/13/19 6\' 2"  (1.88 m)    Weight:   Wt Readings from Last 1 Encounters:  10/12/19 73.6 kg    Ideal Body Weight:  86.4 kg  BMI:  Body mass index is 20.83 kg/m.  Estimated Nutritional Needs:   Kcal:  2200-2400 kcal  Protein:  110-125 grams  Fluid:  >/= 2.2  L/day   Mariana Single RD, LDN Clinical Nutrition Pager # - [phone]

## 2019-10-15 NOTE — TOC Initial Note (Signed)
Transition of Care [facility]) - Initial/Assessment Note    Patient Details  Name: Kenneth Howe MRN: [id number] Date of Birth: December 08, 1927  Transition of Care [facility]) CM/SW Contact:    Vinie Sill, [place] Phone Number: 10/15/2019, 3:13 PM  Clinical Narrative:                  CSW spoke with the patient's, daughter,Kenneth Howe. Patient lives with her daughter, Kenneth Howe, son- in -law and their family. Patient's daughter states she is the care giver for both of her parents.  She has the support of her spouse but she would like for the patient to be a little more independent before returning home.  She agrees that patient would benefit from [facility] rehab at [facility]. Patient's daughter states no preference at this time. CSW was given permission to send referrals to SNFs. CSW explained the SNF process and answered all questions.   CSW will provide bed offers once available.  Thurmond Butts, MSW, [place] Clinical Social Worker   Expected Discharge Plan: Skilled Nursing Facility Barriers to Discharge: SNF Pending bed offer   Patient Goals and CMS Choice Patient states their goals for this hospitalization and ongoing recovery are:: to get strong enough to return back home      Expected Discharge Plan and Services Expected Discharge Plan: [place] In-house Referral: Clinical Social Work     Living arrangements for the past 2 months: Single Family Home                                      Prior Living Arrangements/Services Living arrangements for the past 2 months: Single Family Home Lives with:: Self, Spouse, Minor Children, Adult Children Patient language and need for interpreter reviewed:: No Do you feel safe going back to the place where you live?: No      Need for Family Participation in Patient Care: Yes (Comment) Care giver support system in place?: Yes (comment)   Criminal Activity/Legal Involvement Pertinent to Current Situation/Hospitalization: No - Comment as  needed  Activities of Daily Living      Permission Sought/Granted Permission sought to share information with : Family Supports, Customer service manager, Case Manager Permission granted to share information with : Yes, Verbal Permission Granted  Share Information with NAME: Kenneth Howe  Permission granted to share info w AGENCY: SNFs  Permission granted to share info w Relationship: daughter  Permission granted to share info w Contact Information: [phone]  Emotional Assessment   Attitude/Demeanor/Rapport: Unable to Assess Affect (typically observed): Unable to Assess Orientation: : Oriented to Self, Oriented to Place, Oriented to  Time, Oriented to Situation Alcohol / Substance Use: Not Applicable Psych Involvement: No (comment)  Admission diagnosis:  Acute GI bleeding [K92.2] Patient Active Problem List   Diagnosis Date Noted  . Acute GI bleeding 10/12/2019  . Atrial fibrillation with RVR ([place]) 10/12/2019  . Acute blood loss anemia 10/12/2019  . Sepsis ([place]) 10/12/2019   PCP:  Patient, No Pcp Per Pharmacy:   [facility] [address], [place] [address] Phone: [phone] Fax: [phone]     Social Determinants of Health (SDOH) Interventions    Readmission Risk Interventions No flowsheet data found.

## 2019-10-15 NOTE — Progress Notes (Signed)
[facility] Progress Note  Subjective: The patient is doing well today.  No further bleeding from duodenal ulcer noted.  Objective: Vital signs in last 24 hours: Temp:  [97 F (36.1 C)-98.4 F (36.9 C)] 98.2 F (36.8 C) (12/28 1133) Pulse Rate:  [84-100] 99 (12/28 1133) Resp:  [14-20] 18 (12/28 1133) BP: (113-126)/(53-73) 113/53 (12/28 1133) SpO2:  [97 %-100 %] 98 % (12/28 1133) Weight change:    PE:  No distress  Heart regular rhythm  Abdomen soft and nontender  Lab Results: Results for orders placed or performed during the hospital encounter of 10/12/19 (from the past 24 hour(s))  Glucose, capillary     Status: Abnormal   Collection Time: 10/14/19 11:46 AM  Result Value Ref Range   Glucose-Capillary 117 (H) 70 - 99 mg/dL  Glucose, capillary     Status: None   Collection Time: 10/14/19  4:46 PM  Result Value Ref Range   Glucose-Capillary 95 70 - 99 mg/dL  Glucose, capillary     Status: Abnormal   Collection Time: 10/15/19 12:28 AM  Result Value Ref Range   Glucose-Capillary 113 (H) 70 - 99 mg/dL  CBC     Status: Abnormal   Collection Time: 10/15/19  5:00 AM  Result Value Ref Range   WBC 8.4 4.0 - 10.5 K/uL   RBC 2.67 (L) 4.22 - 5.81 MIL/uL   Hemoglobin 8.0 (L) 13.0 - 17.0 g/dL   HCT 24.7 (L) 39.0 - 52.0 %   MCV 92.5 80.0 - 100.0 fL   MCH 30.0 26.0 - 34.0 pg   MCHC 32.4 30.0 - 36.0 g/dL   RDW 15.9 (H) 11.5 - 15.5 %   Platelets 158 150 - 400 K/uL   nRBC 0.2 0.0 - 0.2 %  Glucose, capillary     Status: Abnormal   Collection Time: 10/15/19  8:06 AM  Result Value Ref Range   Glucose-Capillary 103 (H) 70 - 99 mg/dL   Comment 1 Notify RN    Comment 2 Document in Chart     Studies/Results: No results found.    Assessment: Duodenal ulcer    Plan:   As per Dr. Erlinda Hong note yesterday I agree with continuing oral PPI twice daily for 6 weeks and then transitioning to PPI once a day.  Resume anticoagulation in 2 or 3 days if no further bleeding.   We will sign off.  Call us if needed.    Cassell Clement 10/15/2019, 11:37 AM  Pager: [phone] If no answer or after 5 PM call [phone] Lab Results  Component Value Date   HGB 8.0 (L) 10/15/2019   HGB 8.0 (L) 10/14/2019   HGB 9.0 (L) 10/13/2019   HCT 24.7 (L) 10/15/2019   HCT 24.4 (L) 10/14/2019   HCT 27.3 (L) 10/13/2019   ALKPHOS 46 10/12/2019   AST 38 10/12/2019   ALT 26 10/12/2019

## 2019-10-16 LAB — CBC
HCT: 25.3 % — ABNORMAL LOW (ref 39.0–52.0)
Hemoglobin: 8.4 g/dL — ABNORMAL LOW (ref 13.0–17.0)
MCH: 30.5 pg (ref 26.0–34.0)
MCHC: 33.2 g/dL (ref 30.0–36.0)
MCV: 92 fL (ref 80.0–100.0)
Platelets: 182 10*3/uL (ref 150–400)
RBC: 2.75 MIL/uL — ABNORMAL LOW (ref 4.22–5.81)
RDW: 16 % — ABNORMAL HIGH (ref 11.5–15.5)
WBC: 8.3 10*3/uL (ref 4.0–10.5)
nRBC: 0.2 % (ref 0.0–0.2)

## 2019-10-16 LAB — GLUCOSE, CAPILLARY
Glucose-Capillary: 114 mg/dL — ABNORMAL HIGH (ref 70–99)
Glucose-Capillary: 123 mg/dL — ABNORMAL HIGH (ref 70–99)
Glucose-Capillary: 87 mg/dL (ref 70–99)
Glucose-Capillary: 91 mg/dL (ref 70–99)

## 2019-10-16 MED ORDER — APIXABAN 2.5 MG PO TABS
2.5000 mg | ORAL_TABLET | Freq: Two times a day (BID) | ORAL | Status: DC
  Administered 2019-10-16 – 2019-10-19 (×7): 2.5 mg via ORAL
  Filled 2019-10-16 (×7): qty 1

## 2019-10-16 NOTE — TOC Progression Note (Addendum)
Transition of Care [facility]) - Progression Note    Patient Details  Name: Kenneth Howe MRN: [id number] Date of Birth: [birth year]  [facility] CM/SW [place], [place] Phone Number: 10/16/2019, 5:27 PM  Clinical Narrative:     CSW spoke with patient's daughter,[name]- she requested CSW inquire with [facility] in [place]. CSW contacted [facility] [phone] and left voice message for admissions coordinator to return call.  Patient's daughter's expressed she wants patient to closer to their home in [place], [place]. Patient's daughter inquired about [facility] CSW sent referral and is waiting on response. CSW will continue to follow and assist with discharge planning.   CSW contacted [facility] for estimate of cost to transport pateint- estimated cost [phone]. CSW called and left voice message with patient's daughter of the cost.   Thurmond Butts, MSW, [place] Worker   Expected Discharge Plan: [place] Barriers to Discharge: SNF Pending bed offer  Expected Discharge Plan and Services Expected Discharge Plan: [place] In-house Referral: Clinical Social Work     Living arrangements for the past 2 months: Single Family Home                                       Social Determinants of Health (SDOH) Interventions    Readmission Risk Interventions No flowsheet data found.

## 2019-10-16 NOTE — Progress Notes (Signed)
PROGRESS NOTE  Kenneth Howe GNF:[id number] DOB: Mar 10, 1928 DOA: 10/12/2019 PCP: Patient, No Pcp Per  HPI/Recap of past 24 hours: Brief Narrative    85 y.o.malewithhistory of A. Fib, on Eliquis, gout gout and pacemaker placement was admitted to [facility] 3 days ago after being found to be weak and had some diarrhea. Patient was diagnosed with UTI with sepsis and was placed on empiric antibiotics. Per records urine culture was showing more than 100,000 gram-negative rods blood cultures had not any growth so far., while in the hospital patient started having dark stools and patient was on Eliquis, his hemoglobin dropped from 12.6 to 5.5, he was transferred to [facility] for further work-up.  He did require 2 units PRBC transfusion here(another 1 units was received at [facility]), patient went for endoscopy 12/26, significant for duodenal ulcer(with no stigmata of bleeding).  10/16/19: Seen and examined.  Cough improved, added azithromycin and pulmonary toilet.  No reported GI bleed.  Hemoglobin stable and trending up.    Assessment/Plan: Principal Problem:   Acute GI bleeding Active Problems:   Atrial fibrillation with RVR (HCC)   Acute blood loss anemia   Sepsis (HCC)  Acute GI bleeding in the setting of patient taking Eliquis -  patient received [facility] unit [facility], had another 2 units received here as well . -No reported recurrent bleeding -Hemoglobin is stable and trending up 8.4 -Restart Eliquis 10/16/2019. -Currently on p.o. Protonix 40 mg twice daily. -GI recommendation to continue PPI twice daily for 6 weeks then transitioning to PPI once a day.  To avoid NSAIDs Follow-up with GI outpatient as needed.  Acute bronchitis Cough is improving. Started on Z-Pak on 10/15/2019. Will complete 5 days of IV Rocephin on 10/16/2019. Continue pulmonary toilet and bronchodilators. Independently reviewed chest x-ray done on 10/15/2019 which showed atelectasis versus  infiltrate in the right lower lobe. Afebrile with no leukocytosis.  Improving acute blood loss anemia  -Secondary to upper GI bleed H&H stable and trending down. No sign of overt bleeding.    Resolved sepsis from UTI  - Presented with leukocytosis, tachycardia and positive urine analysis.  Urine culture in process.  Currently on Rocephin empirically.   [facility] showed more than 100,000 gram-negative rods.  Urine culture [facility] no growth final.  Paroxysmal A. fib with RVR - Rate is controlled on p.o. metoprolol  Resume Eliquis for CVA prevention. Hemoglobin is stable.  History of gout. -Continue with allopurinol  Hyperlipidemia -Continue with pravastatin  History of tachybradycardia s/p pacemaker placement.  Physical debility PT recommended SNF Continue PT OT with assistance of for precautions. CSW assisting with placement, pending bed offer.  COVID-19 Labs  Recent Labs (last 2 labs)   No results for input(s): DDIMER, FERRITIN, LDH, CRP in the last 72 hours.    Recent Labs       Lab Results  Component Value Date   SARSCOV2NAA NEGATIVE 10/12/2019       Code Status : Full  Family Communication  : None at bedside  Disposition Plan :  Plan to DC to SNF once bed is available.  Consults  :  GI  Procedures  : EGD  DVT Prophylaxis  :   Eliquis.   Objective: Vitals:   10/15/19 1624 10/15/19 2036 10/16/19 0013 10/16/19 0552  BP: 123/74 125/73 117/76 122/76  Pulse: 82 80  74  Resp: 18 16  (!) 22  Temp: 97.9 F (36.6 C)  98.1  F (36.7 C) 97.9 F (36.6 C)  TempSrc: Oral Oral Oral Oral  SpO2: 98% 99%  100%  Weight:      Height:        Intake/Output Summary (Last 24 hours) at 10/16/2019 0704 Last data filed at 10/16/2019 8250 Gross per 24 hour  Intake 865.07 ml  Output 1260 ml  Net -394.93 ml   Filed Weights   10/12/19 0336  Weight: 73.6 kg    Exam:  . General: 85 y.o. year-old male  well-developed well-nourished in no acute distress.  Alert and interactive.  . Cardiovascular: Regular rate and rhythm no rubs or gallops.   Marland Kitchen Respiratory: Mild rales at bases no wheezing noted.  Good respiratory effort.   . Abdomen: Soft nontender normal bowel sounds present.   . Musculoskeletal: Trace lower extremity edema bilaterally. Psychiatry: Mood is appropriate for condition and setting.  Data Reviewed: CBC: Recent Labs  Lab 10/12/19 0443 10/12/19 1735 10/13/19 0427 10/14/19 0505 10/15/19 0500 10/16/19 0252  WBC 14.8*  --  10.9* 9.0 8.4 8.3  NEUTROABS 11.7*  --   --   --   --   --   HGB 6.6* 8.6* 9.0* 8.0* 8.0* 8.4*  HCT 20.1* 25.5* 27.3* 24.4* 24.7* 25.3*  MCV 91.4  --  91.0 91.0 92.5 92.0  PLT 152  --  160 145* 158 182   Basic Metabolic Panel: Recent Labs  Lab 10/12/19 0443 10/13/19 0427 10/14/19 0505  NA 143 140 139  K 4.2 3.8 3.5  CL 113* 113* 111  CO2 19* 21* 20*  GLUCOSE 115* 97 141*  BUN 41* 28* 23  CREATININE 0.94 0.94 0.93  CALCIUM 7.6* 7.7* 7.6*   GFR: Estimated Creatinine Clearance: 56.1 mL/min (by C-G formula based on SCr of 0.93 mg/dL). Liver Function Tests: Recent Labs  Lab 10/12/19 0443  AST 38  ALT 26  ALKPHOS 46  BILITOT 0.2*  PROT 4.7*  ALBUMIN 2.0*   No results for input(s): LIPASE, AMYLASE in the last 168 hours. No results for input(s): AMMONIA in the last 168 hours. Coagulation Profile: Recent Labs  Lab 10/12/19 0443  INR 1.4*   Cardiac Enzymes: No results for input(s): CKTOTAL, CKMB, CKMBINDEX, TROPONINI in the last 168 hours. BNP (last 3 results) No results for input(s): PROBNP in the last 8760 hours. HbA1C: No results for input(s): HGBA1C in the last 72 hours. CBG: Recent Labs  Lab 10/14/19 1146 10/14/19 1646 10/15/19 0028 10/15/19 0806 10/15/19 1622  GLUCAP 117* 95 113* 103* 121*   Lipid Profile: No results for input(s): CHOL, HDL, LDLCALC, TRIG, CHOLHDL, LDLDIRECT in the last 72 hours. Thyroid Function  Tests: No results for input(s): TSH, T4TOTAL, FREET4, T3FREE, THYROIDAB in the last 72 hours. Anemia Panel: No results for input(s): VITAMINB12, FOLATE, FERRITIN, TIBC, IRON, RETICCTPCT in the last 72 hours. Urine analysis: No results found for: COLORURINE, APPEARANCEUR, LABSPEC, PHURINE, GLUCOSEU, HGBUR, BILIRUBINUR, KETONESUR, PROTEINUR, UROBILINOGEN, NITRITE, LEUKOCYTESUR Sepsis Labs: @LABRCNTIP (procalcitonin:4,lacticidven:4)  ) Recent Results (from the past 240 hour(s))  Culture, blood (routine x 2)     Status: None (Preliminary result)   Collection Time: 10/12/19  4:43 AM   Specimen: BLOOD RIGHT HAND  Result Value Ref Range Status   Specimen Description BLOOD RIGHT HAND  Final   Special Requests   Final    BOTTLES DRAWN AEROBIC ONLY Blood Culture results may not be optimal due to an inadequate volume of blood received in culture bottles   Culture   Final  NO GROWTH 3 DAYS Performed at [facility], [place] [address], [place], [place] [address]    Report Status PENDING  Incomplete  Culture, blood (routine x 2)     Status: None (Preliminary result)   Collection Time: 10/12/19  4:43 AM   Specimen: BLOOD RIGHT FOREARM  Result Value Ref Range Status   Specimen Description BLOOD RIGHT FOREARM  Final   Special Requests   Final    BOTTLES DRAWN AEROBIC ONLY Blood Culture adequate volume   Culture   Final    NO GROWTH 3 DAYS Performed at [facility], [place] [address], [place], [place] [address]    Report Status PENDING  Incomplete  SARS CORONAVIRUS 2 (TAT 6-24 HRS) Nasopharyngeal Nasopharyngeal Swab     Status: None   Collection Time: 10/12/19  5:09 AM   Specimen: Nasopharyngeal Swab  Result Value Ref Range Status   SARS Coronavirus 2 NEGATIVE NEGATIVE Final    Comment: (NOTE) SARS-CoV-2 target nucleic acids are NOT DETECTED. The SARS-CoV-2 RNA is generally detectable in upper and lower respiratory specimens during the acute phase of infection. Negative results do  not preclude SARS-CoV-2 infection, do not rule out co-infections with other pathogens, and should not be used as the sole basis for treatment or other patient management decisions. Negative results must be combined with clinical observations, patient history, and epidemiological information. The expected result is Negative. Fact Sheet for Patients: SugarRoll.be Fact Sheet for Healthcare Providers: https://www.woods-mathews.com/ This test is not yet approved or cleared by the [place] FDA and  has been authorized for detection and/or diagnosis of SARS-CoV-2 by FDA under an Emergency Use Authorization (EUA). This EUA will remain  in effect (meaning this test can be used) for the duration of the COVID-19 declaration under Section 56 4(b)(1) of the Act, 21 U.S.C. section 360bbb-3(b)(1), unless the authorization is terminated or revoked sooner. Performed at [facility], [place] [address], [place], [place] [address]   Culture, Urine     Status: None   Collection Time: 10/14/19  4:59 PM   Specimen: Urine, Clean Catch  Result Value Ref Range Status   Specimen Description URINE, CLEAN CATCH  Final   Special Requests Normal  Final   Culture   Final    NO GROWTH Performed at [facility], [place] [address] [place], [place] [address]    Report Status 10/15/2019 FINAL  Final      Studies: DG CHEST PORT 1 VIEW  Result Date: 10/15/2019 CLINICAL DATA:  Cough. EXAM: PORTABLE CHEST 1 VIEW COMPARISON:  October 12, 2019. FINDINGS: The heart size and mediastinal contours are within normal limits. Left-sided pacemaker is unchanged in position. No pneumothorax is noted. Left lung is clear. Mild right basilar atelectasis or infiltrate is noted. The visualized skeletal structures are unremarkable. IMPRESSION: Mild right basilar atelectasis or infiltrate is noted. Followup radiographs are recommended until resolution. Electronically Signed   By: Marijo Conception M.D.   On: 10/15/2019 15:57    Scheduled Meds: . sodium chloride   Intravenous Once  . allopurinol  100 mg Oral Daily  . azithromycin  500 mg Oral Daily   Followed by  . azithromycin  250 mg Oral Daily  . dextromethorphan-guaiFENesin  2 tablet Oral BID  . feeding supplement (ENSURE ENLIVE)  237 mL Oral BID BM  . ipratropium  0.5 mg Nebulization BID  . levalbuterol  0.63 mg Nebulization BID  . metoprolol succinate  25 mg Oral  Daily  . pantoprazole  40 mg Oral BID AC  . pravastatin  40 mg Oral QHS  . sodium chloride flush  10-40 mL Intracatheter Q12H  . sodium chloride HYPERTONIC  4 mL Nebulization BID    Continuous Infusions: . cefTRIAXone (ROCEPHIN)  IV 2 g (10/16/19 0629)     LOS: 4 days     Darlin Drop, MD [facility] Pager [phone]  If 7PM-7AM, please contact night-coverage [email or web address] Password [email or web address] 10/16/2019, 7:04 AM

## 2019-10-16 NOTE — Progress Notes (Signed)
Physical Therapy Treatment Patient Details Name: Kenneth Howe MRN: [id number] DOB: 01/12/1928 Today's Date: 10/16/2019    History of Present Illness Patient is a 85 y/o male who presents as tx from [place] with UTI with sepsis and acute GI bleed. Endoscopy- duodenal ulcer 12/26. PMH includes A. fib, gout and pacemaker placement.    PT Comments    Patient seen for mobility progression. Pt requires min/mod A for functional transfer training and min A for short distance gait. Limited gait distance due to DOE, elevated HR, and painful bilat LE. Continue to progress as tolerated.     Follow Up Recommendations  SNF;Supervision for mobility/OOB     Equipment Recommendations  3in1 (PT)    Recommendations for Other Services       Precautions / Restrictions Precautions Precautions: Fall Precaution Comments: watch HR Restrictions Weight Bearing Restrictions: No    Mobility  Bed Mobility Overal bed mobility: Needs Assistance Bed Mobility: Supine to Sit     Supine to sit: HOB elevated;Mod assist     General bed mobility comments: assist to elevate trunk into sitting; cues for sequencing   Transfers Overall transfer level: Needs assistance Equipment used: Rolling walker (2 wheeled) Transfers: Sit to/from [facility] Sit to Stand: Mod assist;Min assist Stand pivot transfers: Min assist       General transfer comment: min A to stand from EOB and mod A from recliner (lower surface) and min A for pivot bed to recliner; cues for hand placement and assist to power up  Ambulation/Gait Ambulation/Gait assistance: Min assist Gait Distance (Feet): (6 ft forward then 6 ft backwards) Assistive device: Rolling walker (2 wheeled) Gait Pattern/deviations: Trunk flexed;Step-through pattern;Decreased stride length     General Gait Details: distance limited by DOE, HR into 140s briefly, and knee pain   Stairs             Wheelchair Mobility    Modified Rankin  (Stroke Patients Only)       Balance Overall balance assessment: Needs assistance Sitting-balance support: Feet supported;Single extremity supported Sitting balance-Leahy Scale: Fair     Standing balance support: During functional activity Standing balance-Leahy Scale: Poor                              Cognition Arousal/Alertness: Awake/alert Behavior During Therapy: WFL for tasks assessed/performed Overall Cognitive Status: No family/caregiver present to determine baseline cognitive functioning Area of Impairment: Memory;Problem solving                     Memory: Decreased short-term memory       Problem Solving: Requires verbal cues;Requires tactile cues;Difficulty sequencing General Comments: HOH      Exercises      General Comments        Pertinent Vitals/Pain Pain Assessment: Faces Faces Pain Scale: Hurts even more Pain Location: bil knees, chronic Pain Descriptors / Indicators: Aching;Grimacing;Guarding Pain Intervention(s): Limited activity within patient's tolerance;Monitored during session;Repositioned    Home Living                      Prior Function            PT Goals (current goals can now be found in the care plan section) Acute Rehab PT Goals Patient Stated Goal: to get better Progress towards PT goals: Progressing toward goals    Frequency    Min 3X/week  PT Plan Current plan remains appropriate    Co-evaluation              AM-PAC PT "6 Clicks" Mobility   Outcome Measure  Help needed turning from your back to your side while in a flat bed without using bedrails?: A Lot Help needed moving from lying on your back to sitting on the side of a flat bed without using bedrails?: Total Help needed moving to and from a bed to a chair (including a wheelchair)?: A Lot Help needed standing up from a chair using your arms (e.g., wheelchair or bedside chair)?: A Lot Help needed to walk in hospital  room?: A Little Help needed climbing 3-5 steps with a railing? : A Lot 6 Click Score: 12    End of Session Equipment Utilized During Treatment: Gait belt Activity Tolerance: Patient tolerated treatment well Patient left: in chair;with call bell/phone within reach;with chair alarm set Nurse Communication: Mobility status PT Visit Diagnosis: Muscle weakness (generalized) (M62.81);Unsteadiness on feet (R26.81);Difficulty in walking, not elsewhere classified (R26.2)     Time: 3354-5625 PT Time Calculation (min) (ACUTE ONLY): 34 min  Charges:  $Gait Training: 23-37 mins                     Erline Levine, PTA Acute Rehabilitation Services Pager: [phone] Office: [phone]     Carolynne Edouard 10/16/2019, 11:29 AM

## 2019-10-17 LAB — GLUCOSE, CAPILLARY
Glucose-Capillary: 108 mg/dL — ABNORMAL HIGH (ref 70–99)
Glucose-Capillary: 113 mg/dL — ABNORMAL HIGH (ref 70–99)
Glucose-Capillary: 114 mg/dL — ABNORMAL HIGH (ref 70–99)

## 2019-10-17 LAB — CULTURE, BLOOD (ROUTINE X 2)
Culture: NO GROWTH
Culture: NO GROWTH
Special Requests: ADEQUATE

## 2019-10-17 LAB — CBC
HCT: 25.9 % — ABNORMAL LOW (ref 39.0–52.0)
Hemoglobin: 8.4 g/dL — ABNORMAL LOW (ref 13.0–17.0)
MCH: 30.4 pg (ref 26.0–34.0)
MCHC: 32.4 g/dL (ref 30.0–36.0)
MCV: 93.8 fL (ref 80.0–100.0)
Platelets: 212 10*3/uL (ref 150–400)
RBC: 2.76 MIL/uL — ABNORMAL LOW (ref 4.22–5.81)
RDW: 16.1 % — ABNORMAL HIGH (ref 11.5–15.5)
WBC: 7.6 10*3/uL (ref 4.0–10.5)
nRBC: 0.4 % — ABNORMAL HIGH (ref 0.0–0.2)

## 2019-10-17 NOTE — Progress Notes (Signed)
PROGRESS NOTE  Kenneth Howe ZOX:[id number] DOB: 11/24/1927 DOA: 10/12/2019 PCP: Patient, No Pcp Per  HPI/Recap of past 24 hours: Brief Narrative    85 y.o.malewithhistory of A. Fib, on Eliquis, gout gout and pacemaker placement was admitted to [facility] 3 days ago after being found to be weak and had some diarrhea. Patient was diagnosed with UTI with sepsis and was placed on empiric antibiotics. Per records urine culture was showing more than 100,000 gram-negative rods blood cultures had not any growth so far., while in the hospital patient started having dark stools and patient was on Eliquis, his hemoglobin dropped from 12.6 to 5.5, he was [facility] to [facility] for further work-up.  He did require 2 units PRBC transfusion here(another 1 units was received at [facility]), patient went for endoscopy 12/26, significant for duodenal ulcer(with no stigmata of bleeding).  10/17/19: Seen and examined.  He has no new complaints.  Assessed by PT with recommendation for SNF.  CSW assisting with placement.  Vital signs and H&H are stable.  Assessment/Plan: Principal Problem:   Acute GI bleeding Active Problems:   Atrial fibrillation with RVR (HCC)   Acute blood loss anemia   Sepsis (HCC)  Resolved acute GI bleeding in the setting of patient taking Eliquis -  patient received 1 unit at [facility], had another 2 units received here as well . -No reported recurrent bleeding H&H is stable, vital signs are stable. -Continue Eliquis (restarted 10/16/2019). -Currently on p.o. Protonix 40 mg twice daily. -GI recommendation to continue PPI twice daily for 6 weeks then transitioning to PPI once a day.  To avoid NSAIDs Follow-up with GI outpatient as needed.  Improving acute bronchitis Cough is improving. Continue on Z-Pak (started on 10/15/2019). Will complete 5 days of IV Rocephin on 10/16/2019. Continue pulmonary toilet and bronchodilators. Independently reviewed chest x-ray  done on 10/15/2019 which showed atelectasis versus infiltrate in the right lower lobe. Afebrile with no leukocytosis.  Improving acute blood loss anemia  -Secondary to upper GI bleed H&H stable. No sign of overt bleeding.    Resolved sepsis from UTI  - Presented with leukocytosis, tachycardia and positive urine analysis.  Urine culture in process.  Currently on Rocephin empirically.   urine culture at [facility] showed more than 100,000 gram-negative rods.  Urine [facility] no growth final.  Paroxysmal A. fib with RVR - Rate is controlled on p.o. metoprolol  Continue Eliquis for primary CVA prevention. Hemoglobin is stable.  History of gout. -Continue with allopurinol  Hyperlipidemia -Continue with pravastatin  History of tachybradycardia s/p pacemaker placement.  Physical debility PT recommended SNF Continue PT OT with assistance of for precautions. CSW assisting with placement, pending bed offer.  COVID-19 Labs  Recent Labs (last 2 labs)   No results for input(s): DDIMER, FERRITIN, LDH, CRP in the last 72 hours.    Recent Labs       Lab Results  Component Value Date   SARSCOV2NAA NEGATIVE 10/12/2019       Code Status : Full  Family Communication  : None at bedside  Disposition Plan :  Plan to DC to SNF once bed is available.  Consults  :  GI  Procedures  : EGD  DVT Prophylaxis  :   Eliquis.   Objective: Vitals:   10/16/19 2050 10/17/19 0004 10/17/19 0329 10/17/19 0804  BP: 125/84 115/70 134/79 (!) 130/94  Pulse:   69 86  Resp: (!) 23 (!) 22 19  19  Temp: 98.3 F (36.8 C) 98.1 F (36.7 C) 98.2 F (36.8 C) 98 F (36.7 C)  TempSrc: Oral Oral Oral Oral  SpO2: 100% 100% 95% 98%  Weight:      Height:        Intake/Output Summary (Last 24 hours) at 10/17/2019 0932 Last data filed at 10/17/2019 0804 Gross per 24 hour  Intake 360 ml  Output 800 ml  Net -440 ml   Filed Weights   10/12/19 0336  Weight:  73.6 kg    Exam:  . General: 85 y.o. year-old male well-developed well-nourished no distress.  Alert and oriented x3.  Cardiovascular: Regular rate and rhythm no rubs or gallops.   Marland Kitchen Respiratory: Clear to auscultation no wheezes no rales.  Poor inspiratory effort.   . Abdomen: Soft nontender normal bowel sounds present. Musculoskeletal: Trace lower extremity edema bilaterally.   Psychiatry: Mood is appropriate for condition and setting. Data Reviewed: CBC: Recent Labs  Lab 10/12/19 0443 10/13/19 0427 10/14/19 0505 10/15/19 0500 10/16/19 0252 10/17/19 0324  WBC 14.8* 10.9* 9.0 8.4 8.3 7.6  NEUTROABS 11.7*  --   --   --   --   --   HGB 6.6* 9.0* 8.0* 8.0* 8.4* 8.4*  HCT 20.1* 27.3* 24.4* 24.7* 25.3* 25.9*  MCV 91.4 91.0 91.0 92.5 92.0 93.8  PLT 152 160 145* 158 182 212   Basic Metabolic Panel: Recent Labs  Lab 10/12/19 0443 10/13/19 0427 10/14/19 0505  NA 143 140 139  K 4.2 3.8 3.5  CL 113* 113* 111  CO2 19* 21* 20*  GLUCOSE 115* 97 141*  BUN 41* 28* 23  CREATININE 0.94 0.94 0.93  CALCIUM 7.6* 7.7* 7.6*   GFR: Estimated Creatinine Clearance: 56.1 mL/min (by C-G formula based on SCr of 0.93 mg/dL). Liver Function Tests: Recent Labs  Lab 10/12/19 0443  AST 38  ALT 26  ALKPHOS 46  BILITOT 0.2*  PROT 4.7*  ALBUMIN 2.0*   No results for input(s): LIPASE, AMYLASE in the last 168 hours. No results for input(s): AMMONIA in the last 168 hours. Coagulation Profile: Recent Labs  Lab 10/12/19 0443  INR 1.4*   Cardiac Enzymes: No results for input(s): CKTOTAL, CKMB, CKMBINDEX, TROPONINI in the last 168 hours. BNP (last 3 results) No results for input(s): PROBNP in the last 8760 hours. HbA1C: No results for input(s): HGBA1C in the last 72 hours. CBG: Recent Labs  Lab 10/16/19 0010 10/16/19 0755 10/16/19 1658 10/16/19 2342 10/17/19 0804  GLUCAP 91 87 114* 123* 108*   Lipid Profile: No results for input(s): CHOL, HDL, LDLCALC, TRIG, CHOLHDL, LDLDIRECT in  the last 72 hours. Thyroid Function Tests: No results for input(s): TSH, T4TOTAL, FREET4, T3FREE, THYROIDAB in the last 72 hours. Anemia Panel: No results for input(s): VITAMINB12, FOLATE, FERRITIN, TIBC, IRON, RETICCTPCT in the last 72 hours. Urine analysis: No results found for: COLORURINE, APPEARANCEUR, LABSPEC, PHURINE, GLUCOSEU, HGBUR, BILIRUBINUR, KETONESUR, PROTEINUR, UROBILINOGEN, NITRITE, LEUKOCYTESUR Sepsis Labs: @LABRCNTIP (procalcitonin:4,lacticidven:4)  ) Recent Results (from the past 240 hour(s))  Culture, blood (routine x 2)     Status: None (Preliminary result)   Collection Time: 10/12/19  4:43 AM   Specimen: BLOOD RIGHT HAND  Result Value Ref Range Status   Specimen Description BLOOD RIGHT HAND  Final   Special Requests   Final    BOTTLES DRAWN AEROBIC ONLY Blood Culture results may not be optimal due to an inadequate volume of blood received in culture bottles   Culture   Final  NO GROWTH 4 DAYS Performed at [facility], [place] [address], [place], [place] [address]    Report Status PENDING  Incomplete  Culture, blood (routine x 2)     Status: None (Preliminary result)   Collection Time: 10/12/19  4:43 AM   Specimen: BLOOD RIGHT FOREARM  Result Value Ref Range Status   Specimen Description BLOOD RIGHT FOREARM  Final   Special Requests   Final    BOTTLES DRAWN AEROBIC ONLY Blood Culture adequate volume   Culture   Final    NO GROWTH 4 DAYS Performed at [facility], [place] [address], [place], [place] [address]    Report Status PENDING  Incomplete  SARS CORONAVIRUS 2 (TAT 6-24 HRS) Nasopharyngeal Nasopharyngeal Swab     Status: None   Collection Time: 10/12/19  5:09 AM   Specimen: Nasopharyngeal Swab  Result Value Ref Range Status   SARS Coronavirus 2 NEGATIVE NEGATIVE Final    Comment: (NOTE) SARS-CoV-2 target nucleic acids are NOT DETECTED. The SARS-CoV-2 RNA is generally detectable in upper and lower respiratory specimens during the acute phase  of infection. Negative results do not preclude SARS-CoV-2 infection, do not rule out co-infections with other pathogens, and should not be used as the sole basis for treatment or other patient management decisions. Negative results must be combined with clinical observations, patient history, and epidemiological information. The expected result is Negative. Fact Sheet for Patients: SugarRoll.be Fact Sheet for Healthcare Providers: https://www.woods-mathews.com/ This test is not yet approved or cleared by the [place] FDA and  has been authorized for detection and/or diagnosis of SARS-CoV-2 by FDA under an Emergency Use Authorization (EUA). This EUA will remain  in effect (meaning this test can be used) for the duration of the COVID-19 declaration under Section 56 4(b)(1) of the Act, 21 U.S.C. section 360bbb-3(b)(1), unless the authorization is terminated or revoked sooner. Performed at [facility], [place] [address] [place], [place] [address]   Culture, Urine     Status: None   Collection Time: 10/14/19  4:59 PM   Specimen: Urine, Clean Catch  Result Value Ref Range Status   Specimen Description URINE, CLEAN CATCH  Final   Special Requests Normal  Final   Culture   Final    NO GROWTH Performed at [facility], [place] [address] [place], [place] [address]    Report Status 10/15/2019 FINAL  Final      Studies: No results found.  Scheduled Meds: . sodium chloride   Intravenous Once  . allopurinol  100 mg Oral Daily  . apixaban  2.5 mg Oral BID  . azithromycin  250 mg Oral Daily  . feeding supplement (ENSURE ENLIVE)  237 mL Oral BID BM  . ipratropium  0.5 mg Nebulization BID  . levalbuterol  0.63 mg Nebulization BID  . metoprolol succinate  25 mg Oral Daily  . pantoprazole  40 mg Oral BID AC  . pravastatin  40 mg Oral QHS  . sodium chloride flush  10-40 mL Intracatheter Q12H    Continuous Infusions:    LOS: 5  days     Kayleen Memos, MD [facility] Pager [phone]  If 7PM-7AM, please contact night-coverage [email or web address] Password [email or web address] 10/17/2019, 9:32 AM

## 2019-10-17 NOTE — Care Management Important Message (Signed)
Important Message  Patient Details  Name: GURNEY BALTHAZOR MRN: [id number] Date of Birth: [birth year]   Medicare Important Message Given:  Yes     Shelda Altes 10/17/2019, 8:25 AM

## 2019-10-18 LAB — SARS CORONAVIRUS 2 (TAT 6-24 HRS): SARS Coronavirus 2: NEGATIVE

## 2019-10-18 LAB — GLUCOSE, CAPILLARY
Glucose-Capillary: 97 mg/dL (ref 70–99)
Glucose-Capillary: 142 mg/dL — ABNORMAL HIGH (ref 70–99)

## 2019-10-18 MED ORDER — PANTOPRAZOLE SODIUM 40 MG PO TBEC: 40.0000 mg | DELAYED_RELEASE_TABLET | Freq: Two times a day (BID) | ORAL | 0 refills | Status: AC

## 2019-10-18 MED ORDER — AZITHROMYCIN 250 MG PO TABS: ORAL_TABLET | ORAL | 0 refills | Status: DC

## 2019-10-18 NOTE — Discharge Instructions (Signed)

## 2019-10-18 NOTE — TOC Progression Note (Signed)
Transition of Care [facility]) - Progression Note    Patient Details  Name: Kenneth Howe MRN: [id number] Date of Birth: [birth year]  Transition [facility]) CM/SW [name], Leonidas Phone Number: [phone] 10/18/2019, 12:44 PM  Clinical Narrative:     CSW spoke with patient daughter Kenneth Howe, she is in agreement with [facility], facility informed and can accept patient tomorrow pending covid test. They requested DC summary be sent today due to holiday tomorrow, CSW has faxed DC summary to [facility] at [phone].    Expected Discharge Plan: Skilled Nursing Facility Barriers to Discharge: SNF Pending bed offer  Expected Discharge Plan and Services Expected Discharge Plan: [place] In-house Referral: Clinical Social Work     Living arrangements for the past 2 months: Single Family Home Expected Discharge Date: 10/18/19                                     Social Determinants of Health (SDOH) Interventions    Readmission Risk Interventions No flowsheet data found.

## 2019-10-18 NOTE — Discharge Summary (Signed)
Discharge Summary  Kenneth Howe ZOX:[id number] DOB: 1927-11-08  PCP: Patient, No Pcp Per  Admit date: 10/12/2019 Discharge date: 10/18/2019  Time spent: 35 minutes  Recommendations for Outpatient Follow-up:  1. Follow-up with your primary care provider. 2. Follow-up with GI. 3. Take your medications as prescribed. 4. Continue physical therapy with assistance and fall precautions.  Discharge Diagnoses:  Active Hospital Problems   Diagnosis Date Noted  . Acute GI bleeding 10/12/2019  . Atrial fibrillation with RVR (HCC) 10/12/2019  . Acute blood loss anemia 10/12/2019  . Sepsis (HCC) 10/12/2019    Resolved Hospital Problems  No resolved problems to display.    Discharge Condition: Stable  Diet recommendation: Heart healthy diet.  Vitals:   10/18/19 0807 10/18/19 1143  BP: 123/81 121/78  Pulse: 90 (!) 103  Resp: 18 18  Temp: 98.4 F (36.9 C) 97.9 F (36.6 C)  SpO2: 96% 97%    History of present illness:  85 y.o.malewithhistory of A. Fib,on Eliquis, goutgout and pacemaker placement was admitted to [facility] 3 days ago after being found to be weak and had some diarrhea. Patient was diagnosed with UTI with sepsis and was placed on empiric antibiotics. Per records urine culture was showing more than 100,000 gram-negative rods blood cultures had not any growth so far.,while in the hospital patient started having dark stools and patient was on Eliquis,his hemoglobin dropped from 12.6to 5.5,he was transferred to [facility] for further work-up.He did require 2 units PRBC transfusion here([facility] for [facility]oscopy 10/13/19, significant for duodenal ulcer(with no stigmata of bleeding).   Generalized weakness.  Assessed by PT with recommendation for SNF.    10/18/19: Seen and examined.  No new complaints.  Vital signs and labs reviewed and are stable.  Patient has a bed at SNF for rehab.  On the day of  discharge, the patient was hemodynamically stable.  He will need to follow-up with his primary care provider and GI posthospitalization.  He will also need to take his medications as prescribed and continue physical therapy with assistance and fall precautions.   Hospital Course:  Principal Problem:   Acute GI bleeding Active Problems:   Atrial fibrillation with RVR (HCC)   Acute blood loss anemia   Sepsis (HCC)  Resolved acute upper GI bleeding post EGD on 10/13/2019 by Dr. Ewing Schlein.  -patient received 1 unit at [facility] ED, had another 2 units received here as well. -   No reported recurrent bleeding                        -   EGD showed:- Small hiatal hernia.                           - A single submucosal papule (nodule) found in the                            stomach.                           - Non-bleeding duodenal ulcer with no stigmata of                            bleeding.                           -  Non-bleeding duodenal diverticulum.                           - Normal third portion of the duodenum.                           - The examination was otherwise normal.                           - No specimens collected. H&H stable on Eliquis. -GI recommendation to continue PPI twice daily for 6 weeks then transitioning to PPI once a day.  To avoid NSAIDs Follow-up with GI outpatient as needed.  Resolving acute bronchitis Cough is resolving. Continue on Z-Pak (started on 10/15/2019). Saturation 100% on room air  Improving acute blood loss anemia -Secondary to upper GI bleed H&H stable. No sign of overt bleeding.    Resolved sepsis from UTI -Presented with leukocytosis, tachycardia and positive urine analysis.  Urine culture at [facility] showed more than 100,000 gram-negative rods. Completed course of Rocephin. Urine culture obtained here at [facility] no growth final.  Paroxysmal A. fib with RVR -Rate is controlled on p.o. metoprolol  25 mg twice daily Continue  Eliquis for primary CVA prevention. Hemoglobin is stable.  History of gout. -Continue with allopurinol  Hyperlipidemia -Continue with pravastatin  History of tachybradycardia s/p pacemaker placement. Stable.  Physical debility PT recommended SNF Continue PT OT with assistance of for precautions.  COVID-19 Labs  Recent Labs (last 2 labs)   No results for input(s): DDIMER, FERRITIN, LDH, CRP in the last 72 hours.    Recent Labs       Lab Results  Component Value Date   SARSCOV2NAA NEGATIVE 10/12/2019       Code Status:Full  Consults :GI  Procedures : EGD on 10/13/2019.  DVT Prophylaxis:  Eliquis.    Discharge Exam: BP 121/78 (BP Location: Right Arm)   Pulse (!) 103   Temp 97.9 F (36.6 C) (Oral)   Resp 18   Ht 6\' 2"  (1.88 m)   Wt 73.6 kg   SpO2 97%   BMI 20.83 kg/m  . General: 85 y.o. year-old male well developed well nourished in no acute distress.  Alert and oriented x3. . Cardiovascular: Regular rate and rhythm with no rubs or gallops.  No thyromegaly or JVD noted.  92 Respiratory: Clear to auscultation with no wheezes or rales. Good inspiratory effort. . Abdomen: Soft nontender nondistended with normal bowel sounds x4 quadrants. . Musculoskeletal: No lower extremity edema. 2/4 pulses in all 4 extremities. Marland Kitchen Psychiatry: Mood is appropriate for condition and setting  Discharge Instructions You were cared for by a hospitalist during your hospital stay. If you have any questions about your discharge medications or the care you received while you were in the hospital after you are discharged, you can call the unit and asked to speak with the hospitalist on call if the hospitalist that took care of you is not available. Once you are discharged, your primary care physician will handle any further medical issues. Please note that NO REFILLS for any discharge medications will be authorized once you are discharged, as it is imperative  that you return to your primary care physician (or establish a relationship with a primary care physician if you do not have one) for your aftercare needs so that they can reassess your need for medications  and monitor your lab values.   Allergies as of 10/18/2019      Reactions   Aspirin Other (See Comments)   GI intolerance Abdominal pain      Medication List    STOP taking these medications   ibuprofen 200 MG tablet Commonly known as: ADVIL     TAKE these medications   allopurinol 100 MG tablet Commonly known as: ZYLOPRIM Take 100 mg by mouth daily.   azithromycin 250 MG tablet Commonly known as: ZITHROMAX Acute bronchitis. Start taking on: October 19, 2019   Eliquis 2.5 MG Tabs tablet Generic drug: apixaban Take 2.5 mg by mouth 2 (two) times daily.   fluticasone 50 MCG/ACT nasal spray Commonly known as: FLONASE Place 1 spray into both nostrils daily.   loratadine 10 MG tablet Commonly known as: CLARITIN Take 10 mg by mouth daily.   metoprolol succinate 25 MG 24 hr tablet Commonly known as: TOPROL-XL Take 25 mg by mouth daily.   pantoprazole 40 MG tablet Commonly known as: PROTONIX Take 1 tablet (40 mg total) by mouth 2 (two) times daily before a meal. Please take Protonix p.o. 40 mg twice dailyx42 days then Protonix p.o. 40 mg daily indefinitely.   pravastatin 40 MG tablet Commonly known as: PRAVACHOL Take 40 mg by mouth at bedtime.            Durable Medical Equipment  (From admission, onward)         Start     Ordered   10/14/19 1456  For home use only DME 3 n 1  Once     10/14/19 1455         Allergies  Allergen Reactions  . Aspirin Other (See Comments)    GI intolerance Abdominal pain   Follow-up Information    [facility]. Call in 1 day(s).   Why: Please call for a post hospital follow-up appointment. Contact information: [address] [place] [phone]        Vida RiggerMagod, Marc, MD. Call in 1 day(s).   Specialty: Gastroenterology Why: Please call for a post hospital follow-up appointment. Contact information: [address] [place] [id number] [phone]            The results of significant diagnostics from this hospitalization (including imaging, microbiology, ancillary and laboratory) are listed below for reference.    Significant Diagnostic Studies: DG CHEST PORT 1 VIEW  Result Date: 10/15/2019 CLINICAL DATA:  Cough. EXAM: PORTABLE CHEST 1 VIEW COMPARISON:  October 12, 2019. FINDINGS: The heart size and mediastinal contours are within normal limits. Left-sided pacemaker is unchanged in position. No pneumothorax is noted. Left lung is clear. Mild right basilar atelectasis or infiltrate is noted. The visualized skeletal structures are unremarkable. IMPRESSION: Mild right basilar atelectasis or infiltrate is noted. Followup radiographs are recommended until resolution. Electronically Signed   By: Lupita RaiderJames  Green Jr M.D.   On: 10/15/2019 15:57   DG Chest Port 1 View  Result Date: 10/12/2019 CLINICAL DATA:  Shortness of breath.  Pending COVID 19 test results EXAM: PORTABLE CHEST 1 VIEW COMPARISON:  October 09, 2019 FINDINGS: The heart size and mediastinal contours are stable. Cardiac pacemaker is unchanged. Both lungs are clear. The visualized skeletal structures are stable. IMPRESSION: No active disease. Electronically Signed   By: Sherian ReinWei-Chen  Lin M.D.   On: 10/12/2019 08:31    Microbiology: Recent Results (from the past 240 hour(s))  Culture, blood (routine x 2)  Status: None   Collection Time: 10/12/19  4:43 AM   Specimen: BLOOD RIGHT HAND  Result Value Ref Range Status   Specimen Description BLOOD RIGHT HAND  Final   Special Requests   Final    BOTTLES DRAWN AEROBIC ONLY Blood Culture results may not be optimal due to an inadequate volume of blood received in culture bottles   Culture   Final    NO GROWTH 5  DAYS Performed at [facility], [address], [place], [place] [address]    Report Status 10/17/2019 FINAL  Final  Culture, blood (routine x 2)     Status: None   Collection Time: 10/12/19  4:43 AM   Specimen: BLOOD RIGHT FOREARM  Result Value Ref Range Status   Specimen Description BLOOD RIGHT FOREARM  Final   Special Requests   Final    BOTTLES DRAWN AEROBIC ONLY Blood Culture adequate volume   Culture   Final    NO GROWTH 5 DAYS Performed at [facility], [address], [place], [place] [address]    Report Status 10/17/2019 FINAL  Final  SARS CORONAVIRUS 2 (TAT 6-24 HRS) Nasopharyngeal Nasopharyngeal Swab     Status: None   Collection Time: 10/12/19  5:09 AM   Specimen: Nasopharyngeal Swab  Result Value Ref Range Status   SARS Coronavirus 2 NEGATIVE NEGATIVE Final    Comment: (NOTE) SARS-CoV-2 target nucleic acids are NOT DETECTED. The SARS-CoV-2 RNA is generally detectable in upper and lower respiratory specimens during the acute phase of infection. Negative results do not preclude SARS-CoV-2 infection, do not rule out co-infections with other pathogens, and should not be used as the sole basis for treatment or other patient management decisions. Negative results must be combined with clinical observations, patient history, and epidemiological information. The expected result is Negative. Fact Sheet for Patients: HairSlick.no Fact Sheet for Healthcare Providers: quierodirigir.com This test is not yet approved or cleared by the [place] FDA and  has been authorized for detection and/or diagnosis of SARS-CoV-2 by FDA under an Emergency Use Authorization (EUA). This EUA will remain  in effect (meaning this test can be used) for the duration of the COVID-19 declaration under Section 56 4(b)(1) of the Act, 21 U.S.C. section 360bbb-3(b)(1), unless the authorization is terminated or revoked  sooner. Performed at [facility], [address] [place], [place] [address]   Culture, Urine     Status: None   Collection Time: 10/14/19  4:59 PM   Specimen: Urine, Clean Catch  Result Value Ref Range Status   Specimen Description URINE, CLEAN CATCH  Final   Special Requests Normal  Final   Culture   Final    NO GROWTH Performed at [facility], [address], [place], [place] [address]    Report Status 10/15/2019 FINAL  Final     Labs: Basic Metabolic Panel: Recent Labs  Lab 10/12/19 0443 10/13/19 0427 10/14/19 0505  NA 143 140 139  K 4.2 3.8 3.5  CL 113* 113* 111  CO2 19* 21* 20*  GLUCOSE 115* 97 141*  BUN 41* 28* 23  CREATININE 0.94 0.94 0.93  CALCIUM 7.6* 7.7* 7.6*   Liver Function Tests: Recent Labs  Lab 10/12/19 0443  AST 38  ALT 26  ALKPHOS 46  BILITOT 0.2*  PROT 4.7*  ALBUMIN 2.0*   No results for input(s): LIPASE, AMYLASE in the last 168 hours. No results for input(s): AMMONIA in the last 168 hours.  CBC: Recent Labs  Lab 10/12/19 0443 10/13/19 0427 10/14/19 0505 10/15/19 0500 10/16/19 0252 10/17/19 0324  WBC 14.8* 10.9* 9.0 8.4 8.3 7.6  NEUTROABS 11.7*  --   --   --   --   --   HGB 6.6* 9.0* 8.0* 8.0* 8.4* 8.4*  HCT 20.1* 27.3* 24.4* 24.7* 25.3* 25.9*  MCV 91.4 91.0 91.0 92.5 92.0 93.8  PLT 152 160 145* 158 182 212   Cardiac Enzymes: No results for input(s): CKTOTAL, CKMB, CKMBINDEX, TROPONINI in the last 168 hours. BNP: BNP (last 3 results) No results for input(s): BNP in the last 8760 hours.  ProBNP (last 3 results) No results for input(s): PROBNP in the last 8760 hours.  CBG: Recent Labs  Lab 10/16/19 2342 10/17/19 0804 10/17/19 1613 10/17/19 1949 10/18/19 0628  GLUCAP 123* 108* 113* 114* 97       Signed:  Kayleen Memos, MD [facility] 10/18/2019, 11:47 AM

## 2019-10-18 NOTE — Progress Notes (Signed)
Physical Therapy Treatment Patient Details Name: Kenneth Howe MRN: [id number] DOB: 1928/03/27 Today's Date: 10/18/2019    History of Present Illness Patient is a 85 y/o male who presents as tx from [place] with UTI with sepsis and acute GI bleed. Endoscopy- duodenal ulcer 12/26. PMH includes A. fib, gout and pacemaker placement.    PT Comments    Patient seen for mobility progression. Continue to progress as tolerated with anticipated d/c to SNF for further skilled PT services.    Follow Up Recommendations  SNF;Supervision for mobility/OOB     Equipment Recommendations  3in1 (PT)    Recommendations for Other Services       Precautions / Restrictions Precautions Precautions: Fall Precaution Comments: watch HR Restrictions Weight Bearing Restrictions: No    Mobility  Bed Mobility Overal bed mobility: Needs Assistance Bed Mobility: Supine to Sit     Supine to sit: HOB elevated;Mod assist     General bed mobility comments: assist to elevate trunk into sitting; cues for sequencing   Transfers Overall transfer level: Needs assistance Equipment used: Rolling walker (2 wheeled) Transfers: Sit to/from Stand Sit to Stand: Min assist;From elevated surface         General transfer comment: assist to power up into standing and to steady; cues for hand placement  Ambulation/Gait Ambulation/Gait assistance: Min assist Gait Distance (Feet): 14 Feet Assistive device: Rolling walker (2 wheeled) Gait Pattern/deviations: Trunk flexed;Step-through pattern;Decreased stride length     General Gait Details: cues for upright posture and breathing technique; assist to steady and manage RW when turning in tight space; pt with DOE    Optometrist    Modified Rankin (Stroke Patients Only)       Balance Overall balance assessment: Needs assistance Sitting-balance support: Feet supported;Single extremity supported Sitting balance-Leahy Scale:  Fair     Standing balance support: During functional activity Standing balance-Leahy Scale: Poor                              Cognition Arousal/Alertness: Awake/alert Behavior During Therapy: WFL for tasks assessed/performed Overall Cognitive Status: No family/caregiver present to determine baseline cognitive functioning Area of Impairment: Memory;Problem solving                     Memory: Decreased short-term memory       Problem Solving: Requires verbal cues;Requires tactile cues;Difficulty sequencing General Comments: HOH      Exercises      General Comments        Pertinent Vitals/Pain Pain Assessment: Faces Faces Pain Scale: Hurts little more Pain Location: bil knees, chronic Pain Descriptors / Indicators: Guarding;Grimacing Pain Intervention(s): Limited activity within patient's tolerance;Monitored during session;Repositioned    Home Living                      Prior Function            PT Goals (current goals can now be found in the care plan section) Acute Rehab PT Goals Patient Stated Goal: to get better Progress towards PT goals: Progressing toward goals    Frequency    Min 3X/week      PT Plan Current plan remains appropriate    Co-evaluation              AM-PAC PT "6 Clicks" Mobility   Outcome Measure  Help needed turning from your back to your side while in a flat bed without using bedrails?: A Lot Help needed moving from lying on your back to sitting on the side of a flat bed without using bedrails?: Total Help needed moving to and from a bed to a chair (including a wheelchair)?: A Lot Help needed standing up from a chair using your arms (e.g., wheelchair or bedside chair)?: A Lot Help needed to walk in hospital room?: A Little Help needed climbing 3-5 steps with a railing? : A Lot 6 Click Score: 12    End of Session Equipment Utilized During Treatment: Gait belt Activity Tolerance: Patient  tolerated treatment well Patient left: in chair;with call bell/phone within reach;with chair alarm set Nurse Communication: Mobility status PT Visit Diagnosis: Muscle weakness (generalized) (M62.81);Unsteadiness on feet (R26.81);Difficulty in walking, not elsewhere classified (R26.2)     Time: 4696-2952 PT Time Calculation (min) (ACUTE ONLY): 23 min  Charges:  $Gait Training: 23-37 mins                     Kenneth Howe, Kenneth Howe Acute Rehabilitation Services Pager: [phone] Office: [phone]     Kenneth Howe 10/18/2019, 3:28 PM

## 2019-10-18 NOTE — NC FL2 (Signed)
[place] LEVEL OF CARE SCREENING TOOL     IDENTIFICATION  Patient Name: Kenneth Howe Birthdate: 1928/08/28 Sex: male Admission Date (Current Location): 10/12/2019  [facility] and [place] Number:  Herbalist and Address:  [facility] [place], [place] [address] [place], [place] [address]      Provider Number: [id number]  Attending Physician Name and Address:  Kayleen Memos, DO  Relative Name and Phone Number:  [name] (daughter) [phone]    Current Level of Care: Hospital Recommended Level of Care: [place] Prior Approval Number:    Date Approved/Denied:   PASRR Number: [id number]  Discharge Plan: SNF    Current Diagnoses: Patient Active Problem List   Diagnosis Date Noted  . Acute GI bleeding 10/12/2019  . Atrial fibrillation with RVR ([place]) 10/12/2019  . Acute blood loss anemia 10/12/2019  . Sepsis ([place]) 10/12/2019    Orientation RESPIRATION BLADDER Height & Weight     Self, Place  Normal Incontinent, External catheter Weight: 162 lb 4.1 oz (73.6 kg) Height:  6\' 2"  (188 cm)  BEHAVIORAL SYMPTOMS/MOOD NEUROLOGICAL BOWEL NUTRITION STATUS      Incontinent Diet(see discharge summary)  AMBULATORY STATUS COMMUNICATION OF NEEDS Skin   Extensive Assist Verbally Other (Comment)(MASD buttocks, ecchymosis arms and hands)                       Personal Care Assistance Level of Assistance  Bathing, Feeding, Dressing, Total care Bathing Assistance: Limited assistance Feeding assistance: Limited assistance(able to feed self - needs set up) Dressing Assistance: Limited assistance Total Care Assistance: Maximum assistance   Functional Limitations Info  Sight, Hearing, Speech Sight Info: Adequate Hearing Info: Impaired Speech Info: Adequate    SPECIAL CARE FACTORS FREQUENCY  PT (By licensed PT), OT (By licensed OT)     PT Frequency: min 5x weekly OT Frequency: min 5x weekly            Contractures  Contractures Info: Not present    Additional Factors Info  Code Status, Allergies Code Status Info: full Allergies Info: Aspirin           Current Medications (10/18/2019):  This is the current hospital active medication list Current Facility-Administered Medications  Medication Dose Route Frequency Provider Last Rate Last Admin  . 0.9 %  sodium chloride infusion (Manually program via Guardrails IV Fluids)   Intravenous Once Clarene Essex, MD      . acetaminophen (TYLENOL) tablet 650 mg  650 mg Oral Q6H PRN Clarene Essex, MD       Or  . acetaminophen (TYLENOL) suppository 650 mg  650 mg Rectal Q6H PRN Clarene Essex, MD      . allopurinol (ZYLOPRIM) tablet 100 mg  100 mg Oral Daily Elgergawy, Silver Huguenin, MD   100 mg at 10/18/19 0850  . apixaban (ELIQUIS) tablet 2.5 mg  2.5 mg Oral BID Irene Pap N, DO   2.5 mg at 10/18/19 8366  . azithromycin (ZITHROMAX) tablet 250 mg  250 mg Oral Daily Irene Pap N, DO   250 mg at 10/18/19 2947  . feeding supplement (ENSURE ENLIVE) (ENSURE ENLIVE) liquid 237 mL  237 mL Oral BID BM Elgergawy, Silver Huguenin, MD   237 mL at 10/18/19 0853  . ipratropium (ATROVENT) nebulizer solution 0.5 mg  0.5 mg Nebulization BID Irene Pap N, DO   0.5 mg at 10/18/19 0735  . levalbuterol (XOPENEX) nebulizer solution 0.63 mg  0.63 mg Nebulization  BID Dow Adolph N, DO   0.63 mg at 10/18/19 2633  . metoprolol succinate (TOPROL-XL) 24 hr tablet 25 mg  25 mg Oral Daily Elgergawy, Leana Roe, MD   25 mg at 10/18/19 0851  . ondansetron (ZOFRAN) tablet 4 mg  4 mg Oral Q6H PRN Vida Rigger, MD       Or  . ondansetron [facility]) injection 4 mg  4 mg Intravenous Q6H PRN Vida Rigger, MD      . pantoprazole (PROTONIX) EC tablet 40 mg  40 mg Oral BID AC Vida Rigger, MD   40 mg at 10/18/19 0853  . pravastatin (PRAVACHOL) tablet 40 mg  40 mg Oral QHS Elgergawy, Leana Roe, MD   40 mg at 10/17/19 2111  . sodium chloride flush (NS) 0.9 % injection 10-40 mL  10-40 mL Intracatheter Q12H Vida Rigger, MD    10 mL at 10/18/19 0850  . sodium chloride flush (NS) 0.9 % injection 10-40 mL  10-40 mL Intracatheter PRN Vida Rigger, MD         Discharge Medications: Please see discharge summary for a list of discharge medications.  Relevant Imaging Results:  Relevant Lab Results:   Additional Information SSN: [id number]  Gildardo Griffes, LCSW

## 2019-10-19 LAB — GLUCOSE, CAPILLARY
Glucose-Capillary: 116 mg/dL — ABNORMAL HIGH (ref 70–99)
Glucose-Capillary: 84 mg/dL (ref 70–99)

## 2019-10-19 NOTE — Discharge Summary (Signed)
Discharge Summary  Kenneth Howe:[id number] DOB: [birth year]  PCP: Patient, No Pcp Per  Admit date: 10/12/2019 Discharge date: 10/19/2019  Time spent: 35 minutes  Recommendations for Outpatient Follow-up:  1. Follow-up with your primary care provider. 2. Follow-up with GI. 3. Take your medications as prescribed. 4. Continue physical therapy with assistance and fall precautions.  Discharge Diagnoses:  Active Hospital Problems   Diagnosis Date Noted  . Acute GI bleeding 10/12/2019  . Atrial fibrillation with RVR (HCC) 10/12/2019  . Acute blood loss anemia 10/12/2019  . Sepsis (HCC) 10/12/2019    Resolved Hospital Problems  No resolved problems to display.    Discharge Condition: Stable  Diet recommendation: Heart healthy diet.  Vitals:   10/19/19 0405 10/19/19 0746  BP: 111/74   Pulse:    Resp: (!) 21   Temp: 98.2 F (36.8 C)   SpO2: 100% 95%    History of present illness:  86 y.o.malewithhistory of A. Fib,on Eliquis, goutgout and pacemaker placement was admitted to [facility] 3 days ago after being found to be weak and had some diarrhea. Patient was diagnosed with UTI with sepsis and was placed on empiric antibiotics. Per records urine culture was showing more than 100,000 gram-negative rods blood cultures had not any growth so far.,while in the hospital patient started having dark stools and patient was on Eliquis,his hemoglobin dropped from 12.6to 5.5,he was transferred to [facility] for further work-up.He did require 2 units PRBC transfusion here(another1[facility] went for endoscopy 10/13/19, significant for duodenal ulcer(with no stigmata of bleeding).   Generalized weakness.  Assessed by PT with recommendation for SNF.    10/19/19: Seen and examined.  No new complaints.  Vital signs and labs reviewed and are stable.  Patient has a bed at SNF for rehab.  On the day of discharge, the patient was hemodynamically  stable.  He will need to follow-up with his primary care provider and GI posthospitalization.  He will also need to take his medications as prescribed and continue physical therapy with assistance and fall precautions.   Hospital Course:  Principal Problem:   Acute GI bleeding Active Problems:   Atrial fibrillation with RVR (HCC)   Acute blood loss anemia   Sepsis (HCC)  Resolved acute upper GI bleeding post EGD on 10/13/2019 by Dr. Ewing Schlein.  -patient received 1 unit at [facility] ED, had another 2 units received here as well. -   No reported recurrent bleeding                        -   EGD showed:- Small hiatal hernia.                           - A single submucosal papule (nodule) found in the                            stomach.                           - Non-bleeding duodenal ulcer with no stigmata of                            bleeding.                           -  Non-bleeding duodenal diverticulum.                           - Normal third portion of the duodenum.                           - The examination was otherwise normal.                           - No specimens collected. H&H stable on Eliquis. -GI recommendation to continue PPI twice daily for 6 weeks then transitioning to PPI once a day.  To avoid NSAIDs Follow-up with GI outpatient as needed.  Resolving acute bronchitis Cough is resolving. Continue on Z-Pak (started on 10/15/2019). Saturation 100% on room air  Improving acute blood loss anemia -Secondary to upper GI bleed H&H stable. No sign of overt bleeding.    Resolved sepsis from UTI -Presented with leukocytosis, tachycardia and positive urine analysis.  Urine culture at [facility] showed more than 100,000 gram-negative rods. Completed course of Rocephin. Urine culture obtained here at [facility] no growth final.  Paroxysmal A. fib with RVR -Rate is controlled on p.o. metoprolol  25 mg twice daily Continue Eliquis for primary CVA  prevention. Hemoglobin is stable.  History of gout. -Continue with allopurinol  Hyperlipidemia -Continue with pravastatin  History of tachybradycardia s/p pacemaker placement. Stable.  Physical debility PT recommended SNF Continue PT OT with assistance of for precautions.  COVID-19 Labs  Recent Labs (last 2 labs)   No results for input(s): DDIMER, FERRITIN, LDH, CRP in the last 72 hours.    Recent Labs       Lab Results  Component Value Date   SARSCOV2NAA NEGATIVE 10/12/2019       Code Status:Full  Consults :GI  Procedures : EGD on 10/13/2019.  DVT Prophylaxis:  Eliquis.    Discharge Exam: BP 111/74 (BP Location: Right Arm)   Pulse 80   Temp 98.2 F (36.8 C) (Oral)   Resp (!) 21   Ht 6\' 2"  (1.88 m)   Wt 73.6 kg   SpO2 95%   BMI 20.83 kg/m  . General: 86 y.o. year-old male well-developed well-nourished in no acute distress.  Alert and oriented x3.   . Cardiovascular: Regular rate and rhythm no rubs or gallops.  No JVD or thyromegaly noted.  92 Respiratory: Clear to auscultation no wheezes or rales.  Poor inspiratory effort.   . Abdomen: Soft nontender nondistended normal bowel sounds present. . Musculoskeletal: No lower extremity edema bilaterally.   Marland Kitchen Psychiatry: Mood is appropriate for condition and setting  Discharge Instructions You were cared for by a hospitalist during your hospital stay. If you have any questions about your discharge medications or the care you received while you were in the hospital after you are discharged, you can call the unit and asked to speak with the hospitalist on call if the hospitalist that took care of you is not available. Once you are discharged, your primary care physician will handle any further medical issues. Please note that NO REFILLS for any discharge medications will be authorized once you are discharged, as it is imperative that you return to your primary care physician (or establish  a relationship with a primary care physician if you do not have one) for your aftercare needs so that they can reassess your need for medications and monitor your lab  values.   Allergies as of 10/19/2019      Reactions   Aspirin Other (See Comments)   GI intolerance Abdominal pain      Medication List    STOP taking these medications   ibuprofen 200 MG tablet Commonly known as: ADVIL     TAKE these medications   allopurinol 100 MG tablet Commonly known as: ZYLOPRIM Take 100 mg by mouth daily.   azithromycin 250 MG tablet Commonly known as: ZITHROMAX Acute bronchitis.   Eliquis 2.5 MG Tabs tablet Generic drug: apixaban Take 2.5 mg by mouth 2 (two) times daily.   fluticasone 50 MCG/ACT nasal spray Commonly known as: FLONASE Place 1 spray into both nostrils daily.   loratadine 10 MG tablet Commonly known as: CLARITIN Take 10 mg by mouth daily.   metoprolol succinate 25 MG 24 hr tablet Commonly known as: TOPROL-XL Take 25 mg by mouth daily.   pantoprazole 40 MG tablet Commonly known as: PROTONIX Take 1 tablet (40 mg total) by mouth 2 (two) times daily before a meal. Please take Protonix p.o. 40 mg twice dailyx42 days then Protonix p.o. 40 mg daily indefinitely.   pravastatin 40 MG tablet Commonly known as: PRAVACHOL Take 40 mg by mouth at bedtime.            Durable Medical Equipment  (From admission, onward)         Start     Ordered   10/14/19 1456  For home use only DME 3 n 1  Once     10/14/19 1455         Allergies  Allergen Reactions  . Aspirin Other (See Comments)    GI intolerance Abdominal pain    Contact information for follow-up providers    [place]. Call in 1 day(s).   Why: Please call for a post hospital follow-up appointment. Contact information: [place] [address] [phone]       Clarene Essex, MD. Call in 1 day(s).   Specialty: Gastroenterology Why:  Please call for a post hospital follow-up appointment. Contact information: [address] [place] [address] [phone]            Contact information for after-discharge care    Destination    [facility] .   Service: Skilled Nursing Contact information: [place] [phone]                   The results of significant diagnostics from this hospitalization (including imaging, microbiology, ancillary and laboratory) are listed below for reference.    Significant Diagnostic Studies: DG CHEST PORT 1 VIEW  Result Date: 10/15/2019 CLINICAL DATA:  Cough. EXAM: PORTABLE CHEST 1 VIEW COMPARISON:  October 12, 2019. FINDINGS: The heart size and mediastinal contours are within normal limits. Left-sided pacemaker is unchanged in position. No pneumothorax is noted. Left lung is clear. Mild right basilar atelectasis or infiltrate is noted. The visualized skeletal structures are unremarkable. IMPRESSION: Mild right basilar atelectasis or infiltrate is noted. Followup radiographs are recommended until resolution. Electronically Signed   By: Marijo Conception M.D.   On: 10/15/2019 15:57   DG Chest Port 1 View  Result Date: 10/12/2019 CLINICAL DATA:  Shortness of breath.  Pending COVID 19 test results EXAM: PORTABLE CHEST 1 VIEW COMPARISON:  October 09, 2019 FINDINGS: The heart size and mediastinal contours are stable. Cardiac pacemaker is unchanged. Both  lungs are clear. The visualized skeletal structures are stable. IMPRESSION: No active disease. Electronically Signed   By: Sherian Rein M.D.   On: 10/12/2019 08:31    Microbiology: Recent Results (from the past 240 hour(s))  Culture, blood (routine x 2)     Status: None   Collection Time: 10/12/19  4:43 AM   Specimen: BLOOD RIGHT HAND  Result Value Ref Range Status   Specimen Description BLOOD RIGHT HAND  Final   Special Requests   Final     BOTTLES DRAWN AEROBIC ONLY Blood Culture results may not be optimal due to an inadequate volume of blood received in culture bottles   Culture   Final    NO GROWTH 5 DAYS Performed at [facility], [address] [place], [place] [address]    Report Status 10/17/2019 FINAL  Final  Culture, blood (routine x 2)     Status: None   Collection Time: 10/12/19  4:43 AM   Specimen: BLOOD RIGHT FOREARM  Result Value Ref Range Status   Specimen Description BLOOD RIGHT FOREARM  Final   Special Requests   Final    BOTTLES DRAWN AEROBIC ONLY Blood Culture adequate volume   Culture   Final    NO GROWTH 5 DAYS Performed at [facility], [address], [place], [place] [address]    Report Status 10/17/2019 FINAL  Final  SARS CORONAVIRUS 2 (TAT 6-24 HRS) Nasopharyngeal Nasopharyngeal Swab     Status: None   Collection Time: 10/12/19  5:09 AM   Specimen: Nasopharyngeal Swab  Result Value Ref Range Status   SARS Coronavirus 2 NEGATIVE NEGATIVE Final    Comment: (NOTE) SARS-CoV-2 target nucleic acids are NOT DETECTED. The SARS-CoV-2 RNA is generally detectable in upper and lower respiratory specimens during the acute phase of infection. Negative results do not preclude SARS-CoV-2 infection, do not rule out co-infections with other pathogens, and should not be used as the sole basis for treatment or other patient management decisions. Negative results must be combined with clinical observations, patient history, and epidemiological information. The expected result is Negative. Fact Sheet for Patients: HairSlick.no Fact Sheet for Healthcare Providers: quierodirigir.com This test is not yet approved or cleared by the [place] FDA and  has been authorized for detection and/or diagnosis of SARS-CoV-2 by FDA under an Emergency Use Authorization (EUA). This EUA will remain  in effect (meaning this test can be used) for the duration  of the COVID-19 declaration under Section 56 4(b)(1) of the Act, 21 U.S.C. section 360bbb-3(b)(1), unless the authorization is terminated or revoked sooner. Performed at [facility], [address], [place], [place] [address]   Culture, Urine     Status: None   Collection Time: 10/14/19  4:59 PM   Specimen: Urine, Clean Catch  Result Value Ref Range Status   Specimen Description URINE, CLEAN CATCH  Final   Special Requests Normal  Final   Culture   Final    NO GROWTH Performed at [facility], [address], [place], [place] [address]    Report Status 10/15/2019 FINAL  Final  SARS CORONAVIRUS 2 (TAT 6-24 HRS) Nasopharyngeal Nasopharyngeal Swab     Status: None   Collection Time: 10/18/19 11:34 AM   Specimen: Nasopharyngeal Swab  Result Value Ref Range Status   SARS Coronavirus 2 NEGATIVE NEGATIVE Final    Comment: (NOTE) SARS-CoV-2 target nucleic acids are NOT DETECTED. The SARS-CoV-2 RNA is generally detectable in upper  and lower respiratory specimens during the acute phase of infection. Negative results do not preclude SARS-CoV-2 infection, do not rule out co-infections with other pathogens, and should not be used as the sole basis for treatment or other patient management decisions. Negative results must be combined with clinical observations, patient history, and epidemiological information. The expected result is Negative. Fact Sheet for Patients: HairSlick.nohttps://www.fda.gov/media/138098/download Fact Sheet for Healthcare Providers: quierodirigir.comhttps://www.fda.gov/media/138095/download This test is not yet approved or cleared by the [place] FDA and  has been authorized for detection and/or diagnosis of SARS-CoV-2 by FDA under an Emergency Use Authorization (EUA). This EUA will remain  in effect (meaning this test can be used) for the duration of the COVID-19 declaration under Section 56 4(b)(1) of the Act, 21 U.S.C. section 360bbb-3(b)(1), unless the authorization is  terminated or revoked sooner. Performed at [facility], [address] [place], [place] [id number]      Labs: Basic Metabolic Panel: Recent Labs  Lab 10/13/19 0427 10/14/19 0505  NA 140 139  K 3.8 3.5  CL 113* 111  CO2 21* 20*  GLUCOSE 97 141*  BUN 28* 23  CREATININE 0.94 0.93  CALCIUM 7.7* 7.6*   Liver Function Tests: No results for input(s): AST, ALT, ALKPHOS, BILITOT, PROT, ALBUMIN in the last 168 hours. No results for input(s): LIPASE, AMYLASE in the last 168 hours. No results for input(s): AMMONIA in the last 168 hours. CBC: Recent Labs  Lab 10/13/19 0427 10/14/19 0505 10/15/19 0500 10/16/19 0252 10/17/19 0324  WBC 10.9* 9.0 8.4 8.3 7.6  HGB 9.0* 8.0* 8.0* 8.4* 8.4*  HCT 27.3* 24.4* 24.7* 25.3* 25.9*  MCV 91.0 91.0 92.5 92.0 93.8  PLT 160 145* 158 182 212   Cardiac Enzymes: No results for input(s): CKTOTAL, CKMB, CKMBINDEX, TROPONINI in the last 168 hours. BNP: BNP (last 3 results) No results for input(s): BNP in the last 8760 hours.  ProBNP (last 3 results) No results for input(s): PROBNP in the last 8760 hours.  CBG: Recent Labs  Lab 10/17/19 1949 10/18/19 0628 10/18/19 1601 10/19/19 0004 10/19/19 0805  GLUCAP 114* 97 142* 116* 84       Signed:  Darlin Droparole N Worthington Cruzan, MD [facility] 10/19/2019, 10:41 AM

## 2019-10-19 NOTE — Progress Notes (Signed)
Pt discharged today to SNIFF ([facility]) via [facility].  Pt's midline removed.  Pt taken off telemetry and CCMD informed.  Handoff report at [facility] given to [facility] LPN.  Pt left with all of their personal belongings.  AVS documentation reviewed with Pt and all questions answered.  AVS documentation sent with [facility].

## 2019-10-19 NOTE — TOC Transition Note (Signed)
Transition of Care [facility]) - CM/SW Discharge Note   Patient Details  Name: Kenneth Howe MRN: [id number] Date of Birth: 08/25/1928  Transition of Care [facility]) CM/SW Contact:  Gildardo Griffes, [place] Phone Number: [phone] 10/19/2019, 10:27 AM   Clinical Narrative:     Patient will DC to: [facility] Anticipated DC date: 10/19/2019 Family notified: Dewayne Hatch (daughter) Transport YK:ZLDJ  Per MD patient ready for DC to [facility] . RN, patient, patient's family, and facility notified of DC. Discharge Summary sent to facility. RN given number for report [phone] Room 703. DC packet on chart. Ambulance transport requested for patient.  CSW signing off.  [place], [place] [phone]   Final next level of care: Skilled Nursing Facility Barriers to Discharge: No Barriers Identified   Patient Goals and CMS Choice Patient states their goals for this hospitalization and ongoing recovery are:: to get strong enough to return back home CMS Medicare.gov Compare Post Acute Care list provided to:: Patient Represenative (must comment)([name] (daughter)) Choice offered to / list presented to : Adult Children  Discharge Placement PASRR number recieved: 10/18/19            Patient chooses bed at: [facility] [place] Patient to be transferred to facility by: [facility] Name of family member notified: [name] Patient and family notified of of transfer: 10/19/19  Discharge Plan and Services In-house Referral: Clinical Social Work                                   Social Determinants of Health (SDOH) Interventions     Readmission Risk Interventions No flowsheet data found.

## 2019-10-19 NOTE — Plan of Care (Signed)
  Problem: Education: Goal: Knowledge of General Education information will improve Description Including pain rating scale, medication(s)/side effects and non-pharmacologic comfort measures Outcome: Progressing   Problem: Health Behavior/Discharge Planning: Goal: Ability to manage health-related needs will improve Outcome: Progressing   

## 2021-03-25 IMAGING — DX DG CHEST 1V PORT
1 series · 1 of 1 positions shown · non-contrast
Comparison: October 09, 2019

CLINICAL DATA: Shortness of breath.  Pending COVID 19 test results

EXAM:
PORTABLE CHEST 1 VIEW

[chest ap]
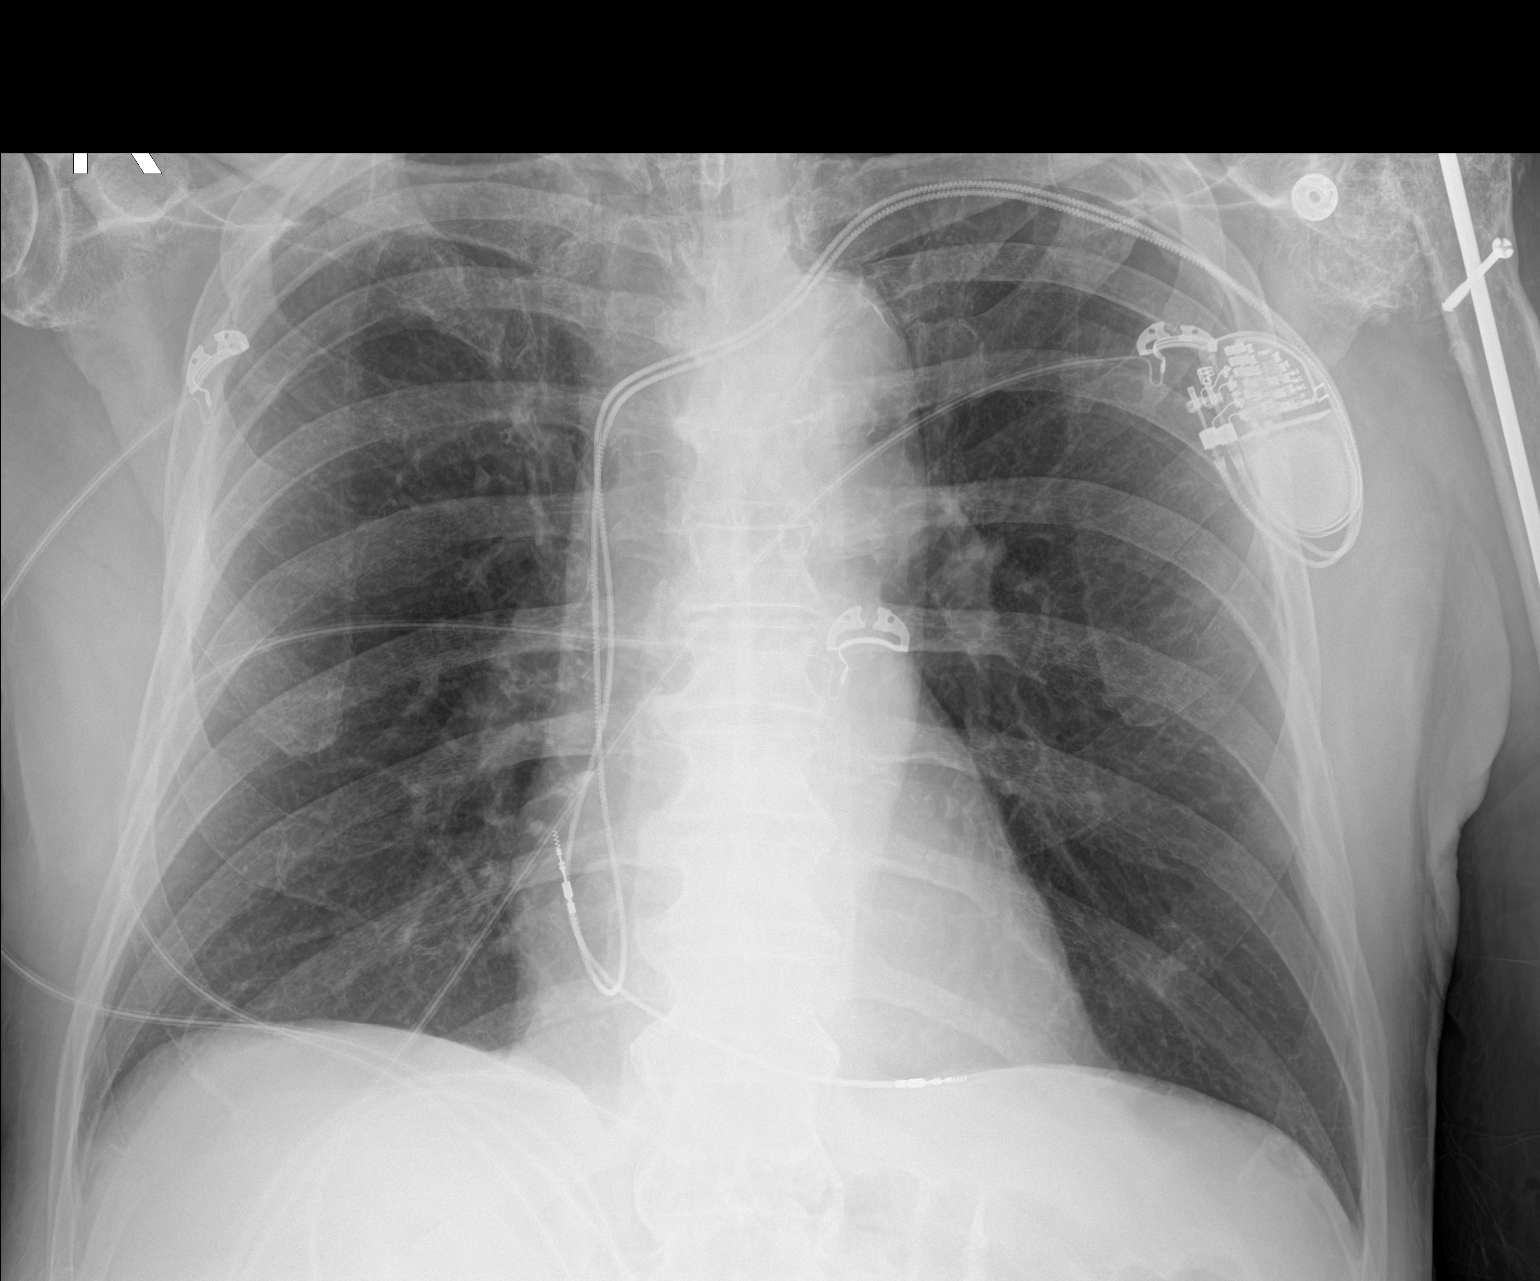

[1 of 1 positions shown; findings below may reference images not displayed]

FINDINGS: The heart size and mediastinal contours are stable. Cardiac
pacemaker is unchanged. Both lungs are clear. The visualized
skeletal structures are stable.
IMPRESSION: No active disease.

## 2023-06-19 DEATH — deceased
